# Patient Record
Sex: Female | Born: 1960 | Race: White | Hispanic: No | Marital: Married | Smoking: Never smoker
Health system: Southern US, Community
[De-identification: ages and names within clinical notes are randomized; demographics above are authoritative.]

## PROBLEM LIST (undated history)

## (undated) DIAGNOSIS — G510 Bell's palsy: Secondary | ICD-10-CM

## (undated) DIAGNOSIS — M1990 Unspecified osteoarthritis, unspecified site: Secondary | ICD-10-CM

## (undated) DIAGNOSIS — F32A Depression, unspecified: Secondary | ICD-10-CM

## (undated) DIAGNOSIS — C801 Malignant (primary) neoplasm, unspecified: Secondary | ICD-10-CM

## (undated) DIAGNOSIS — F329 Major depressive disorder, single episode, unspecified: Secondary | ICD-10-CM

## (undated) HISTORY — PX: BREAST SURGERY: SHX581

## (undated) HISTORY — PX: APPENDECTOMY: SHX54

## (undated) HISTORY — PX: COLONOSCOPY: SHX174

## (undated) HISTORY — PX: TUBAL LIGATION: SHX77

## (undated) HISTORY — PX: CERVICAL POLYPECTOMY: SHX88

---

## 2009-08-11 ENCOUNTER — Ambulatory Visit: Payer: Self-pay | Admitting: Cardiology

## 2009-08-15 ENCOUNTER — Ambulatory Visit: Payer: Self-pay | Admitting: Cardiovascular Disease

## 2009-08-15 ENCOUNTER — Ambulatory Visit (HOSPITAL_COMMUNITY): Admission: AD | Admit: 2009-08-15 | Discharge: 2009-08-15 | Payer: Self-pay | Admitting: Cardiovascular Disease

## 2014-11-28 ENCOUNTER — Telehealth: Payer: Self-pay | Admitting: *Deleted

## 2014-11-28 NOTE — Telephone Encounter (Signed)
Called pt and confirmed 12/06/14 appt w/ her.  Mailed before appt letter, calendar, welcoming packet & intake form to pt.  Placed a copy to records in Dr. Geralyn Flash box and took one to HIM to scan.

## 2014-12-06 ENCOUNTER — Ambulatory Visit (HOSPITAL_BASED_OUTPATIENT_CLINIC_OR_DEPARTMENT_OTHER): Payer: BLUE CROSS/BLUE SHIELD | Admitting: Hematology and Oncology

## 2014-12-06 ENCOUNTER — Ambulatory Visit: Payer: BLUE CROSS/BLUE SHIELD

## 2014-12-06 ENCOUNTER — Encounter: Payer: Self-pay | Admitting: Hematology and Oncology

## 2014-12-06 VITALS — BP 124/73 | HR 68 | Temp 98.4°F | Resp 18 | Ht 67.0 in | Wt 168.6 lb

## 2014-12-06 DIAGNOSIS — Z801 Family history of malignant neoplasm of trachea, bronchus and lung: Secondary | ICD-10-CM

## 2014-12-06 DIAGNOSIS — Z803 Family history of malignant neoplasm of breast: Secondary | ICD-10-CM

## 2014-12-06 DIAGNOSIS — D0511 Intraductal carcinoma in situ of right breast: Secondary | ICD-10-CM | POA: Insufficient documentation

## 2014-12-06 DIAGNOSIS — Z170 Estrogen receptor positive status [ER+]: Secondary | ICD-10-CM

## 2014-12-06 NOTE — Progress Notes (Signed)
[facility] NOTE  Patient Care Team: Adrian Prows, PA-C as PCP - General (Physician Assistant)  CHIEF COMPLAINTS/PURPOSE OF CONSULTATION:  Newly diagnosed right breast DCIS  HISTORY OF PRESENTING ILLNESS:  Bailey Berry 54 y.o. female is here because of recent diagnosis of right breast DCIS. She had a routine screening mammogram on 09/28/2014 which showed an increase in microcalcifications that led to a needle biopsy of 10/31/2014. It showed high-grade DCIS. She underwent right breast lumpectomy with clear margins and was started on tamoxifen. She developed some side effects to tamoxifen, mostly fatigue and she was asked to hold therapy for a few days. Her aunt is my patient. She had undergone genetic counselling. Results are pending  I reviewed her records extensively and collaborated the history with the patient.  MEDICAL HISTORY:  No illnesses other than the recent DCIS SURGICAL HISTORY: Lumpectomy SOCIAL HISTORY: Denies any tobacco alcohol or recreational drug use She works in her [employer] and has 4 children married originally came from [place]  FAMILY HISTORY: Paternal grandmother had breast cancer in 67s, paternal aunt had breast cancer at 19, father died of lung cancer, paternal uncle also had lung cancer  Gynecologic history: Age of menarche 15, age of first childbirth 29, she is still premenopausal. Gravida 4 para 4  ALLERGIES:  has No Known Allergies.  MEDICATIONS:  Current Outpatient Prescriptions  Medication Sig Dispense Refill  . Coenzyme Q10 (CO Q-10) 100 MG CAPS Take 1 capsule by mouth daily.    . Omega-3 Fatty Acids (FISH OIL PO) Take 1 tablet by mouth daily.    . Vitamin D, Ergocalciferol, (DRISDOL) 50000 UNITS CAPS capsule Take 50,000 Units by mouth once a week.  3  . tamoxifen (NOLVADEX) 20 MG tablet Take 20 mg by mouth daily.  6   No current facility-administered medications for this visit.    REVIEW OF SYSTEMS:    Constitutional: Denies fevers, chills or abnormal night sweats Eyes: Denies blurriness of vision, double vision or watery eyes Ears, nose, mouth, throat, and face: Denies mucositis or sore throat Respiratory: Denies cough, dyspnea or wheezes Cardiovascular: Denies palpitation, chest discomfort or lower extremity swelling Gastrointestinal:  Denies nausea, heartburn or change in bowel habits Skin: Denies abnormal skin rashes Lymphatics: Denies new lymphadenopathy or easy bruising Neurological:Denies numbness, tingling or new weaknesses Behavioral/Psych: Mood is stable, no new changes  All other systems were reviewed with the patient and are negative.  PHYSICAL EXAMINATION:  Filed Vitals:   12/06/14 1532  BP: 124/73  Pulse: 68  Temp: 98.4 F (36.9 C)  Resp: 18   Filed Weights   12/06/14 1532  Weight: 168 lb 9.6 oz (76.476 kg)    GENERAL:alert, no distress and comfortable SKIN: skin color, texture, turgor are normal, no rashes or significant lesions EYES: normal, conjunctiva are pink and non-injected, sclera clear OROPHARYNX:no exudate, no erythema and lips, buccal mucosa, and tongue normal  NECK: supple, thyroid normal size, non-tender, without nodularity LYMPH:  no palpable lymphadenopathy in the cervical, axillary or inguinal LUNGS: clear to auscultation and percussion with normal breathing effort HEART: regular rate & rhythm and no murmurs and no lower extremity edema ABDOMEN:abdomen soft, non-tender and normal bowel sounds Musculoskeletal:no cyanosis of digits and no clubbing  PSYCH: alert & oriented x 3 with fluent speech NEURO: no focal motor/sensory deficits  RADIOGRAPHIC STUDIES: I have personally reviewed the radiological reports and agreed with the findings in the report.  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 She started  Tamoxifen and feels fatigued.  She is here for a second opinion regarding her care Recommendation: 1. Agree with Dr.Katragadda regarding the plan for adjuvant tamoxifen and Radiation. 2. I discussed the [place] which predicts risk of relapse with the above treatment of 5% over 10 years.  Patient has undergone genetic testing and her aunt is my patient and we will watch for those results.  Patient will follow with her oncologist for her further care. Thank you for allowing me to participate in her care  All questions were answered. The patient knows to call the clinic with any problems, questions or concerns.    Rulon Eisenmenger, MD 8:56 PM

## 2014-12-06 NOTE — Progress Notes (Signed)
Checked in new pt with no financial concerns at this time. ° °

## 2014-12-06 NOTE — Assessment & Plan Note (Signed)
DCIS Breast: ER PR Positive Grade 3, without comedo necrosis: S/P Lumpectomy and starting XRT next week. She started Tamoxifen and feels fatigued.  She is here for a second opinion regarding her care Recommendation: 1. Agree with Dr.Katragadda regarding the plan for adjuvant tamoxifen and Radiation. 2. I discussed the [place] which predicts risk of relapse with the above treatment of 5% over 10 years.  Patient has undergone genetic testing and her aunt is my patient and we will watch for those results.  Patient will follow with her oncologist for her further care. Thank you for allowing me to participate in her care

## 2014-12-07 ENCOUNTER — Telehealth: Payer: Self-pay | Admitting: *Deleted

## 2014-12-07 NOTE — Telephone Encounter (Signed)
Received progress notes from [facility] to scan.

## 2014-12-07 NOTE — Progress Notes (Signed)
Addendum: Correction to the diagnosis in the assessment and plan: DCIS involving right breast (not left breast)

## 2016-06-27 ENCOUNTER — Ambulatory Visit (INDEPENDENT_AMBULATORY_CARE_PROVIDER_SITE_OTHER): Payer: Self-pay | Admitting: Sports Medicine

## 2016-06-28 ENCOUNTER — Ambulatory Visit (INDEPENDENT_AMBULATORY_CARE_PROVIDER_SITE_OTHER): Payer: BLUE CROSS/BLUE SHIELD | Admitting: Sports Medicine

## 2016-06-28 DIAGNOSIS — M25562 Pain in left knee: Secondary | ICD-10-CM | POA: Diagnosis not present

## 2016-06-28 DIAGNOSIS — M25462 Effusion, left knee: Secondary | ICD-10-CM | POA: Diagnosis not present

## 2016-07-08 ENCOUNTER — Ambulatory Visit (INDEPENDENT_AMBULATORY_CARE_PROVIDER_SITE_OTHER): Payer: Self-pay | Admitting: Physician Assistant

## 2016-07-23 ENCOUNTER — Telehealth (INDEPENDENT_AMBULATORY_CARE_PROVIDER_SITE_OTHER): Payer: Self-pay | Admitting: Sports Medicine

## 2016-07-23 LAB — SYNOVIAL FLUID, CRYSTAL
CRYSTALS, JOINT FLUID: POSITIVE
NUCLEATED CELL COUNT: 570

## 2016-07-23 MED ORDER — COLCHICINE 0.6 MG PO CAPS: 1.0000 | ORAL_CAPSULE | Freq: Two times a day (BID) | ORAL | 1 refills | Status: DC | PRN

## 2016-07-23 NOTE — Telephone Encounter (Signed)
Theone Murdoch (sister-in-law) calling because patient had fluid drawn from the L knee at her last visit on 06/28/16.  She is asking if the fluid was sent to the lab and if so, what are the results.  Patient is in extreme pain, the swelling has increased, and she is unable to bend her knee.  Levada Dy states patient is seeking advice ASAP.  Will make appointment once she has spoke to the doctor or an clinic assistant.

## 2016-07-23 NOTE — Telephone Encounter (Signed)
Talked with patient and advised her of message concerning Rx that was sent into her pharmacy.

## 2016-07-23 NOTE — Telephone Encounter (Signed)
Prescription for colchicine called in. She did have a positive finding for pseudogout. There is no other intervention other than medication to perform at this time unless she does not have any improvement with the colchicine.  I'm happy to see her back if she is having persistent pain & swelling. If any lack of improvement please have her worked in  for repeat aspiration & injection with consideration of obtaining an MRI.

## 2016-07-23 NOTE — Telephone Encounter (Signed)
Patient did have labs drawn from her last office visit in [facility]. She would like to know about the results. Please see message concerning left knee pain.

## 2016-07-24 ENCOUNTER — Telehealth (INDEPENDENT_AMBULATORY_CARE_PROVIDER_SITE_OTHER): Payer: Self-pay

## 2016-07-24 MED ORDER — COLCHICINE 0.6 MG PO TABS: 0.6000 mg | ORAL_TABLET | Freq: Two times a day (BID) | ORAL | 1 refills | Status: DC | PRN

## 2016-07-24 MED ORDER — MELOXICAM 15 MG PO TABS: 15.0000 mg | ORAL_TABLET | Freq: Every day | ORAL | 0 refills | Status: DC

## 2016-07-24 NOTE — Telephone Encounter (Signed)
Received a fax stating that Colchicine 0.6mg  tablets was rejected.  Would like to know if Rx can be changed to something different? Please advise.

## 2016-07-24 NOTE — Telephone Encounter (Signed)
Generic for colchicine sent in to see if this will be covered. Additionally I'm sending a prescription for meloxicam that she can try to take to help reduce any additional inflammation. Please call & inform her of the above.

## 2016-07-24 NOTE — Addendum Note (Signed)
Addended by: Teresa Coombs D on: 07/24/2016 01:26 PM   Modules accepted: Orders

## 2016-07-25 NOTE — Telephone Encounter (Signed)
Talked with patient and advised her of message concerning Rx's

## 2016-08-06 ENCOUNTER — Telehealth (INDEPENDENT_AMBULATORY_CARE_PROVIDER_SITE_OTHER): Payer: Self-pay | Admitting: Physician Assistant

## 2016-08-06 NOTE — Telephone Encounter (Signed)
Patient says Dr (she doesn't know what Dr.) prescribed her some medication for her knee pain, this miedication is  not covered by insurance, its been two weeks. she doesn't know the name of the medication nor what it is for. She is requesting a different medication that her insurance will cover   pls call to discuss cb#: [phone]

## 2016-08-07 NOTE — Telephone Encounter (Signed)
I called spoke with Bailey Berry patients friend, saved in patients chart and explained the current situation. She does have pseudogout looking at crystals analysis. Advised she does not need to take medication if she is not symptomatic. Advised that unfortunately there is no other medication on the market for this. Advised it would be something she would take until symptoms resolved which would typically be within a couple days with this medication. She expressed understanding and is going to relay this to the patient.

## 2016-08-07 NOTE — Telephone Encounter (Signed)
I called patient, she states generic for colchicine and colchicine are both not covered. She states her labs were negative. I did not see uric acid in her chart. Do you want to pursue medication refill or disregard? She states the swelling has resolved and she is feeling better.

## 2016-08-07 NOTE — Telephone Encounter (Signed)
She is not experiencing any pain it is fine to hold off on filling the medication. She did have pseudogout on Crystal analysis. If colchicine is unaffordable & is okay to not fill this but it is an option down the road if she has worsening symptoms. Unfortunately there are no other generic alternatives.

## 2017-06-16 ENCOUNTER — Encounter: Payer: Self-pay | Admitting: Sports Medicine

## 2017-06-16 ENCOUNTER — Ambulatory Visit (INDEPENDENT_AMBULATORY_CARE_PROVIDER_SITE_OTHER): Payer: BLUE CROSS/BLUE SHIELD

## 2017-06-16 ENCOUNTER — Ambulatory Visit (INDEPENDENT_AMBULATORY_CARE_PROVIDER_SITE_OTHER): Payer: BLUE CROSS/BLUE SHIELD | Admitting: Sports Medicine

## 2017-06-16 ENCOUNTER — Ambulatory Visit: Payer: BLUE CROSS/BLUE SHIELD

## 2017-06-16 VITALS — BP 122/82 | HR 75 | Ht 67.0 in | Wt 159.4 lb

## 2017-06-16 DIAGNOSIS — M25512 Pain in left shoulder: Secondary | ICD-10-CM

## 2017-06-16 DIAGNOSIS — M4722 Other spondylosis with radiculopathy, cervical region: Secondary | ICD-10-CM | POA: Diagnosis not present

## 2017-06-16 DIAGNOSIS — G8929 Other chronic pain: Secondary | ICD-10-CM

## 2017-06-16 DIAGNOSIS — M25511 Pain in right shoulder: Secondary | ICD-10-CM

## 2017-06-16 MED ORDER — METHYLPREDNISOLONE 4 MG PO TBPK: ORAL_TABLET | ORAL | 0 refills | Status: DC

## 2017-06-16 MED ORDER — GABAPENTIN 300 MG PO CAPS: ORAL_CAPSULE | ORAL | 1 refills | Status: DC

## 2017-06-16 NOTE — Patient Instructions (Addendum)
++++++++++++++++++++++++++++++++++++++++++++    If you would like to hold off on picking up the gabapentin that is okay but I do recommend that you pick up the Medrol Dosepak.  ++++++++++++++++++++++++++++++++++++++++++++  Also check out [employer]" which is a program developed by Dr. Minerva Ends.   There are links to a couple of his YouTube Videos below and I would like to see performing one of his videos 5-6 days per week.    A good intro video is: "Independence from Pain 7-minute Video" - [email or web address].com   His more advanced video is: "Powerful Posture and Pain Relief: 12 minutes of Foundation Training" - https://youtu.be/[email or web address]  Do not try to attempt this entire video when first beginning.    Try breaking of each exercise that he goes into shorter segments.  Otherwise if they perform an exercise for 45 seconds, start with 15 seconds and rest and then resume when they begin the new activity.    If you work your way up to doing this 12 minute video, I expect you will see significant improvements in your pain.  If you enjoy his videos and would like to find out more you can look on his website: [email or web address]  He has a workout streaming option as well as a DVD set available for purchase.  Amazon has the best price for his DVDs.

## 2017-06-16 NOTE — Progress Notes (Signed)
OFFICE VISIT NOTE Bailey Berry. Bailey Berry, [place] at [facility] [phone]  Bailey Berry - 56 y.o. female MRN [id number]  Date of birth: 1961-03-17  Visit Date: 06/16/2017  PCP: Bailey Prows, PA-C   Referred by: Bailey Prows, PA-C  Bailey Berry, CMA acting as scribe for Dr. Paulla Fore.  SUBJECTIVE:   Chief Complaint  Patient presents with  . bilateral shoulder pain   HPI: As below and per problem based documentation when appropriate.  Bailey Berry is a former pt at [facility] presenting today for evaluation of bilateral shoulder pain.  Pain has been recent x 3-4 years but has gotten worse over the past 4-5 months.  No known injury or trauma to the shoulders or c-spine.   The pain is described as pinching and is rated as 5/10-10/10 with movement.  Worsened while lying in bed. Pain is worse when trying to grab/grasp objects. She denies weakness in either arm.  Improves with rest. Therapies tried include : She has injection about 3 years. She has been seen by PT in the past and she did benefit from this but sx have returned and are worse. She is not taking OTC meds for the pain. She has not tried using heat or ice.   Other associated symptoms include: She has some radiation of pain into her arm and numbness in her hands.   She has not had xray c-spine or xray of either shoulder in over a year.      Review of Systems  Constitutional: Positive for chills. Negative for fever.  Respiratory: Negative for shortness of breath and wheezing.   Cardiovascular: Negative for chest pain and palpitations.  Musculoskeletal: Positive for neck pain.  Neurological: Positive for tingling. Negative for dizziness, weakness and headaches.    Otherwise per HPI.  HISTORY & PERTINENT PRIOR DATA:  Prior History reviewed and updated per electronic medical record.  Significant history, findings, studies and interim changes include:  reports  that  has never smoked. she has never used smokeless tobacco. No results for input(s): HGBA1C, LABURIC, CREATINE in the last 8760 hours. No specialty comments available. Problem  Osteoarthritis of Spine With Radiculopathy, Cervical Region   MRI Cervical Spine: 1. Severe right foraminal stenosis at C5-6 secondary to asymmetric uncovertebral and facet disease. 2. Moderate right central canal narrowing at C5-6 with some distortion of the right-sided cord but no abnormal cord signal. 3. Moderate central and bilateral foraminal stenosis at C6-7 secondary to a broad-based disc osteophyte complex and bilateral uncovertebral spurring. 4. Mild right foraminal narrowing at C4-5. 5. Mild central and bilateral foraminal narrowing at C3-4.      OBJECTIVE:  VS:  HT:5\' 7"  (170.2 cm)   WT:159 lb 6.4 oz (72.3 kg)  BMI:24.96    BP:122/82  HR:75bpm  TEMP: ( )  RESP:98 %  PHYSICAL EXAM: Constitutional: WDWN, Non-toxic appearing. Psychiatric: Alert & appropriately interactive. Not depressed or anxious appearing. Respiratory: No increased work of breathing. Trachea Midline Eyes: Pupils are equal. EOM intact without nystagmus. No scleral icterus  UPPER EXTREMITIES No clubbing or cyanosis appreciated Capillary Refill is normal, less than 2 seconds No signficant upper extremity generalized edema Radial Pulses: Normal and symmetrically palpable Sensation in UE dermatomes: intact to light touch    SHOULDER Findings: No stiffness, no weakness, no crepitus noted  Neck:   Well aligned, no significant torticollis  Midline Bony TTP: none   Paraspinal Muscle Spasm: Yes, bilateral  Limited side bending  and rotation of cervical spine bilaterally.  NEURAL TENSION SIGNS Right Left  Brachial Plexus Squeeze: positive, moderate pain positive, moderate pain  Arm Squeeze Test: positive, severe pain positive, moderate pain  Spurling's Compression Test: positive, mild pain positive, mild pain    Lhermitte's Compression test: Negative, no radiating pain   REFLEXES Right Left  DTR - C5 -Biceps  2+ 2+  DTR - C6 - Brachiorad 1+ 2+  DTR - C7 - Triceps 2+ 2+   UMN - Hoffman's Negative/Normal Negative/Normal  UMN - Pectoral Negative/Normal Negative/Normal   MOTOR TESTING: Intact in all UE myotomes    No additional findings.   ASSESSMENT & PLAN:   1. Chronic pain of both shoulders   2. Osteoarthritis of spine with radiculopathy, cervical region    PLAN:    Osteoarthritis of spine with radiculopathy, cervical region Bilateral shoulder pain consistent with cervical radiculitis.  She does have a slightly diminished brachioradialis reflex on the right but otherwise her upper extremity strength is intact.  She does have pain with Spurling's compression test and should benefit from steroid Dosepak and gabapentin.  We will have him again on postural exercises.  If any lack of improvement MRI of her cervical spine would be indicated she has been through aggressive physical therapy and is having now worsening motor symptoms but no focal weakness today.  No evidence of upper motor nerve lesions however the cramping is slightly concerning for this and if any lack of improvement once again MRI prior to her next visit will be indicated.   ++++++++++++++++++++++++++++++++++++++++++++ Orders & Meds: Orders Placed This Encounter  Procedures  . DG Cervical Spine Complete    Meds ordered this encounter  Medications  . DISCONTD: methylPREDNISolone (MEDROL DOSEPAK) 4 MG TBPK tablet    Sig: Take by mouth as directed. Take 6 tablets on the first day prescribed then as directed.    Dispense:  21 tablet    Refill:  0  . DISCONTD: gabapentin (NEURONTIN) 300 MG capsule    Sig: Start with 1 tab po qhs X 1 week, then increase to 1 tab po bid X 1 week then 1 tab po tid prn    Dispense:  90 capsule    Refill:  1    ++++++++++++++++++++++++++++++++++++++++++++ Follow-up: Return in about 4 weeks  (around 07/14/2017).   Pertinent documentation may be included in additional procedure notes, imaging studies, problem based documentation and patient instructions. Please see these sections of the encounter for additional information regarding this visit. CMA/ATC served as Education administrator during this visit. History, Physical, and Plan performed by medical provider. Documentation and orders reviewed and attested to.      Gerda Diss, [facility] Physician

## 2017-06-16 NOTE — Assessment & Plan Note (Addendum)
Bilateral shoulder pain consistent with cervical radiculitis.  She does have a slightly diminished brachioradialis reflex on the right but otherwise her upper extremity strength is intact.  She does have pain with Spurling's compression test and should benefit from steroid Dosepak and gabapentin.  We will have him again on postural exercises.  If any lack of improvement MRI of her cervical spine would be indicated she has been through aggressive physical therapy and is having now worsening motor symptoms but no focal weakness today.  No evidence of upper motor nerve lesions however the cramping is slightly concerning for this and if any lack of improvement once again MRI prior to her next visit will be indicated.

## 2017-07-14 ENCOUNTER — Encounter: Payer: Self-pay | Admitting: Sports Medicine

## 2017-07-14 ENCOUNTER — Ambulatory Visit: Payer: BLUE CROSS/BLUE SHIELD | Admitting: Sports Medicine

## 2017-07-14 VITALS — BP 104/70 | HR 71 | Ht 67.0 in | Wt 162.4 lb

## 2017-07-14 DIAGNOSIS — M25512 Pain in left shoulder: Secondary | ICD-10-CM

## 2017-07-14 DIAGNOSIS — M4722 Other spondylosis with radiculopathy, cervical region: Secondary | ICD-10-CM

## 2017-07-14 DIAGNOSIS — G8929 Other chronic pain: Secondary | ICD-10-CM | POA: Diagnosis not present

## 2017-07-14 DIAGNOSIS — M25511 Pain in right shoulder: Secondary | ICD-10-CM

## 2017-07-14 NOTE — Assessment & Plan Note (Signed)
Persistent symptoms in spite of conservative measures.  Mild improvement with corticosteroids but no change in symptoms with gabapentinoids or therapeutic exercises.  MRI indicated at this time.  Follow-up after MRI to review results.  Question if cervical ribs are  contributing to potential TOS versus true cervical radiculitis.

## 2017-07-14 NOTE — Patient Instructions (Addendum)
We are ordering an MRI for you today.  The imaging office will be calling you to schedule your appointment after we obtain authorization from your insurance company.   Please be sure you have signed up for MyChart so that we can get your results to you.  We will be in touch with you as soon as we can.  Please know, it can take up to 3-4 business days for the radiologist and Dr. Rigby to have time to review the results and determine the best appropriate action.  If there is something that appears to be surgical or needs a referral to other specialists we will let you know through MyChart or telephone.  Otherwise we will plan to schedule a follow up appointment with Dr. Rigby once we have the results.    [facility]: [phone]  

## 2017-07-14 NOTE — Progress Notes (Addendum)
OFFICE VISIT NOTE [name]. Dayzha Pogosyan, [place] at [facility] [phone]  Johnnetta Holstine - 56 y.o. female MRN [id number]  Date of birth: 1961/07/07  Visit Date: 07/14/2017  PCP: Adrian Prows, PA-C   Referred by: Adrian Prows, PA-C  Josepha Pigg, CMA acting as scribe for Dr. Paulla Fore.  SUBJECTIVE:   Chief Complaint  Patient presents with  . Follow-up    bilateral shoulder pain   HPI: As below and per problem based documentation when appropriate.  Bailey Berry is an established patient presenting today in follow-up of bilateral shoulder pain and osteoarthritis c-spine with radiculopathy. She was last seen 06/16/2017 and was prescribed a Medrol Dosepak and Gabapentin was recommended. She was provided with website for [facility] exercises.   Pt has completed Medrol Dosepak. She did pick up the Gabapentin and tried taking it for about 10 days but doesn't feel like it was helping with her sx. She did notice some improvement with the Medrol Dosepak. She is still having a lot of pain when lying down at night for bed. 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 She is not taking any OTC meds for the pain. She has not tried using heat or ice. Pain improves when at rest. When she tries to grab something the pain does radiate into the posterior aspect of both forearms. Overall she does feel like sx have gotten a little better but definitely not where she would like to be. She has noticed some weakness in both arms. She continues to have trouble with her fingers "getting stuck" while she is trying to hold things.     Review of Systems  Constitutional: Positive for chills. Negative for fever and malaise/fatigue.  Respiratory: Positive for shortness of breath. Negative for wheezing.   Cardiovascular: Positive for palpitations. Negative for chest pain.  Musculoskeletal:  Positive for myalgias.  Neurological: Positive for dizziness and weakness. Negative for tingling and headaches.    Otherwise per HPI.  HISTORY & PERTINENT PRIOR DATA:  No specialty comments available. She reports that  has never smoked. she has never used smokeless tobacco. No results for input(s): HGBA1C, LABURIC, CREATINE in the last 8760 hours.  Invalid input(s): CR Allergies reviewed per EMR Prior to Admission medications   Medication Sig Start Date End Date Taking? Authorizing Provider  lovastatin (MEVACOR) 20 MG tablet Take 20 mg by mouth at bedtime. 05/18/17  Yes [provider]  tamoxifen (NOLVADEX) 20 MG tablet Take 20 mg by mouth daily. 11/21/14  Yes [provider]   Patient Active Problem List   Diagnosis Date Noted  . Osteoarthritis of spine with radiculopathy, cervical region 06/16/2017  . Ductal carcinoma in situ (DCIS) of right breast 12/06/2014   History reviewed. No pertinent past medical history. History reviewed. No pertinent family history. History reviewed. No pertinent surgical history. Social History   Occupational History  . Not on file  Tobacco Use  . Smoking status: Never Smoker  . Smokeless tobacco: Never Used  Substance and Sexual Activity  . Alcohol use: Not on file  . Drug use: Not on file  . Sexual activity: Not on file    OBJECTIVE:  VS:  HT:5\' 7"  (170.2 cm)   WT:162 lb 6.4 oz (73.7 kg)  BMI:25.43    BP:104/70  HR:71bpm  TEMP: ( )  RESP:98 % EXAM: Findings:  WDWN, NAD, Non-toxic  appearing Alert & appropriately interactive Not depressed or anxious appearing No increased work of breathing. Pupils are equal. EOM intact without nystagmus No clubbing or cyanosis of the extremities appreciated No significant rashes/lesions/ulcerations overlying the examined area. Radial pulses 2+/4.  No significant generalized UE edema. Sensation intact to light touch in upper extremities.  Neck & Shoulders: Well aligned, no significant  torticollis No significant midline tenderness.   Right paraspinal musculature. Cervical ROM: Slightly limited in cervical rotation and side bending to the right. NEURAL TENSION SIGNS Right       Brachial Plexus Squeeze: tender       Arm Squeeze Test: tender       Spurling's Compression Test:  Ipsilateral -mild pain without radiation           Contralateral marked discomfort Left       Brachial Plexus Squeeze: Mild pain      Arm Squeeze Test: No pain       Spurling's Compression Test:  Ipsilateral -causes pain to the contralateral right base of the neck           Contralateral no pain Lhermitte's Compression test:  Pain causing radiation down the spine   REFLEXES                           Right                         Left DTR - C5 -Biceps               3+/4                       3+/4 DTR - C6 - Brachiorad  1+/4                       3+/4 DTR - C7 - Triceps              1+/4                       3+/4 UMN - Hoffman's    questionable negative/Normal  MOTOR TESTING: Small amount of weakness with elbow extension on R compared to left otherwise intact    RADIOLOGY: DG Cervical Spine Complete CLINICAL DATA:  56 year old female chronic neck and bilateral shoulder pain. No recent trauma/ injury. Initial encounter.  EXAM: CERVICAL SPINE - COMPLETE 4+ VIEW  COMPARISON:  None.  FINDINGS: Normal alignment without fracture or abnormal prevertebral soft tissue swelling.  Moderate C6-7 disc space narrowing with osteophyte. Bilateral uncinate hypertrophy. Mild bilateral C6-7 bony neural foraminal narrowing.  Tiny cervical ribs.  Lung apices clear.  Question aortic knob calcification.  IMPRESSION: Moderate C6-7 cervical spondylotic changes with mild bilateral bony neural foraminal narrowing.  Electronically Signed   By: Genia Del M.D.   On: 06/16/2017 19:55  ASSESSMENT & PLAN:     ICD-10-CM   1. Chronic pain of both shoulders M25.511 MR Cervical Spine Wo Contrast    G89.29    M25.512   2. Osteoarthritis of spine with radiculopathy, cervical region M47.22 MR Cervical Spine Wo Contrast   ================================================================= Osteoarthritis of spine with radiculopathy, cervical region Persistent symptoms in spite of conservative measures.  Mild improvement with corticosteroids but no change in symptoms with gabapentinoids or therapeutic exercises.  MRI indicated at this time.  Follow-up after MRI to review results.  Question if cervical ribs are  contributing to potential TOS versus true cervical radiculitis.  No notes on file =================================================================  Follow-up: Return for MRI review.   CMA/ATC served as Education administrator during this visit. History, Physical, and Plan performed by medical provider. Documentation and orders reviewed and attested to.      Teresa Coombs, [place] [facility] Physician

## 2017-07-25 ENCOUNTER — Ambulatory Visit (INDEPENDENT_AMBULATORY_CARE_PROVIDER_SITE_OTHER): Payer: BLUE CROSS/BLUE SHIELD | Admitting: Orthopaedic Surgery

## 2017-07-27 ENCOUNTER — Ambulatory Visit
Admission: RE | Admit: 2017-07-27 | Discharge: 2017-07-27 | Disposition: A | Discharge status: Home / Self Care | Payer: BLUE CROSS/BLUE SHIELD | Source: Ambulatory Visit | Attending: Sports Medicine | Admitting: Sports Medicine

## 2017-07-27 DIAGNOSIS — G8929 Other chronic pain: Secondary | ICD-10-CM

## 2017-07-27 DIAGNOSIS — M25512 Pain in left shoulder: Principal | ICD-10-CM

## 2017-07-27 DIAGNOSIS — M25511 Pain in right shoulder: Principal | ICD-10-CM

## 2017-07-27 DIAGNOSIS — M4722 Other spondylosis with radiculopathy, cervical region: Secondary | ICD-10-CM

## 2017-08-04 ENCOUNTER — Encounter: Payer: Self-pay | Admitting: Sports Medicine

## 2017-08-04 ENCOUNTER — Ambulatory Visit: Payer: BLUE CROSS/BLUE SHIELD | Admitting: Sports Medicine

## 2017-08-04 VITALS — BP 116/74 | HR 78 | Ht 67.0 in | Wt 162.2 lb

## 2017-08-04 DIAGNOSIS — M25512 Pain in left shoulder: Secondary | ICD-10-CM

## 2017-08-04 DIAGNOSIS — G8929 Other chronic pain: Secondary | ICD-10-CM | POA: Diagnosis not present

## 2017-08-04 DIAGNOSIS — M4722 Other spondylosis with radiculopathy, cervical region: Secondary | ICD-10-CM

## 2017-08-04 DIAGNOSIS — M25511 Pain in right shoulder: Secondary | ICD-10-CM

## 2017-08-04 NOTE — Progress Notes (Signed)
Called pt and scheduled her for f/u today at 2:40 pm. - MW

## 2017-08-04 NOTE — Patient Instructions (Signed)
[facility] will be reaching out to schedule the epidural steroid injection.  If you have not heard from them in the next several days please call our office and inform us of this.

## 2017-08-04 NOTE — Assessment & Plan Note (Signed)
Long discussion today regarding interventional options.  Patient would like to avoid surgical intervention at this time although we did recommend neurosurgical consultation given the increased risk of spinal cord injury in the setting of even a minimal impact given the amount of stenosis.  There are no overt cord changes at this time but I did discuss that there is an increased risk of cervical spinal cord injury with any type of even minimal impact. She would like to proceed with epidural steroid injection and I will plan to follow-up with her to ensure good clinical improvement.  If any lack of improvement, persistent symptoms or worsening symptoms neurosurgical intervention will be warranted in the interim.

## 2017-08-04 NOTE — Progress Notes (Signed)
Bailey Berry. Bailey Berry, [place] at [place]  Bailey Berry - 56 y.o. female MRN [id number]  Date of birth: Aug 23, 1961 Scribe for today's visit: Wendy Poet, ATC    SUBJECTIVE:  Bailey Berry is here for evaluation of Follow-up (c-spine pain and B shoulder pain) .   Compared to the last office visit on 07/14/17, her previously described symptoms of B neck/upper trap pain and L arm pain show no change. Current symptoms are currently mild in nature while she's at relative rest but severe when she's attempting to grab a pan and lift something. She has not been using any medications currently but has tried both a Medrol dose pack and Gabapentin previously.   ROS Reports night time disturbances. Denies fevers, chills, or night sweats. Denies unexplained weight loss. Reports personal history of cancer. Denies changes in bowel or bladder habits. Denies recent unreported falls. Denies new or worsening dyspnea or wheezing. Reports headaches or dizziness.  Reports numbness, tingling or weakness  In the extremities.  Denies dizziness or presyncopal episodes Denies lower extremity edema    HISTORY & PERTINENT PRIOR DATA:  Prior History reviewed and updated per electronic medical record. Significant history, findings, studies and interim changes include: No additional findings.  reports that  has never smoked. she has never used smokeless tobacco. No results for input(s): HGBA1C, LABURIC, CREATINE in the last 8760 hours. Problem  Osteoarthritis of Spine With Radiculopathy, Cervical Region   MRI Cervical Spine: 1. Severe right foraminal stenosis at C5-6 secondary to asymmetric uncovertebral and facet disease. 2. Moderate right central canal narrowing at C5-6 with some distortion of the right-sided cord but no abnormal cord signal. 3. Moderate central and bilateral foraminal stenosis at C6-7 secondary to a broad-based disc  osteophyte complex and bilateral uncovertebral spurring. 4. Mild right foraminal narrowing at C4-5. 5. Mild central and bilateral foraminal narrowing at C3-4.      OBJECTIVE:  VS:  HT:5\' 7"  (170.2 cm)   WT:162 lb 3.2 oz (73.6 kg)  BMI:25.4    BP:116/74  HR:78bpm  TEMP: ( )  RESP:98 %  PHYSICAL EXAM: Constitutional: WDWN, Non-toxic appearing. Psychiatric: Alert & appropriately interactive.Not depressed or anxious appearing. Respiratory: No increased work of breathing. Trachea Midline Eyes: Pupils are equal. EOM intact without nystagmus. No scleral icterus  Bilateral upper extremity NEUROVASCULAR exam: No clubbing or cyanosis appreciated No significant pretibial/lower extremity peripheral edema No significant venous stasis changes Capillary Refill: normal, less than 2 seconds  Radial Pulses: symmetrically palpable   Sensation in examined extremities: Deficit noted - Bilateral C5-6 generalized dysesthesia.  Neck & Shoulders: Well aligned, no significant torticollis No significant midline tenderness.   Cervical ROM: Limited to side bending, rotation and flexion extension  NEURAL TENSION SIGNS Right       Brachial Plexus Squeeze: Painful       Arm Squeeze Test: Mildly painful      Spurling's Compression Test:  Ipsilateral -deferred  Left       Brachial Plexus Squeeze: Painful       Arm Squeeze Test: Painful       Spurling's Compression Test:  Ipsilateral -deferred  Lhermitte's Compression test:  Deferred   REFLEXES                           Right  Left DTR - C5 -Biceps               2+/4                       2+/4 DTR - C6 - Brachiorad  1+/4                       2+/4 DTR - C7 - Triceps              2+/4                       2+/4 UMN - Hoffman's negative/Normal negative/Normal  MOTOR TESTING: Intact in all UE myotomes  ASSESSMENT & PLAN:   1. Osteoarthritis of spine with radiculopathy, cervical region   2. Chronic pain of both shoulders      Plan:>50% of this 25 minute visit spent in direct patient counseling and/or coordination of care.  Discussion was focused on education regarding the in discussing the pathoetiology and anticipated clinical course of the above condition.  Osteoarthritis of spine with radiculopathy, cervical region Long discussion today regarding interventional options.  Patient would like to avoid surgical intervention at this time although we did recommend neurosurgical consultation given the increased risk of spinal cord injury in the setting of even a minimal impact given the amount of stenosis.  There are no overt cord changes at this time but I did discuss that there is an increased risk of cervical spinal cord injury with any type of even minimal impact. She would like to proceed with epidural steroid injection and I will plan to follow-up with her to ensure good clinical improvement.  If any lack of improvement, persistent symptoms or worsening symptoms neurosurgical intervention will be warranted in the interim.   ++++++++++++++++++++++++++++++++++++++++++++ Orders:  Orders Placed This Encounter  Procedures  . DG INJECT DIAG/THERA/INC NEEDLE/CATH/PLC EPI/LUMB/SAC W/IMG    Meds:  No orders of the defined types were placed in this encounter.   ++++++++++++++++++++++++++++++++++++++++++++ Follow-up: Return in about 8 weeks (around 09/29/2017).   Pertinent documentation may be included in additional procedure notes, imaging studies, problem based documentation and patient instructions. Please see these sections of the encounter for additional information regarding this visit. CMA/ATC served as Education administrator during this visit. History, Physical, and Plan performed by medical provider. Documentation and orders reviewed and attested to.      [facility] Physician    08/04/2017 6:51 PM

## 2017-08-12 ENCOUNTER — Other Ambulatory Visit: Payer: Self-pay

## 2017-08-12 ENCOUNTER — Telehealth: Payer: Self-pay

## 2017-08-12 NOTE — Telephone Encounter (Signed)
Copied from [place]. Topic: Referral - Question >> Aug 12, 2017  1:55 PM Valla Leaver wrote: Reason for CRM: Dr. Paulla Fore placed an order for epidural for this patient but it requires authorization from [facility]. [facility] can not authorize it. Patient scheduled for 12/10. Please advise.

## 2017-08-13 NOTE — Telephone Encounter (Signed)
Authorization obtained by provider, and documented in appointment and order notes.  Auth# [id number] No further action needed.

## 2017-08-13 NOTE — Telephone Encounter (Signed)
Peer-to-peer performed authorization number is [id number] [facility].  Appointment on 08/18/2017 his first day of approval is approved.

## 2017-08-18 ENCOUNTER — Other Ambulatory Visit: Payer: BLUE CROSS/BLUE SHIELD

## 2017-09-03 ENCOUNTER — Ambulatory Visit
Admission: RE | Admit: 2017-09-03 | Discharge: 2017-09-03 | Disposition: A | Discharge status: Home / Self Care | Payer: BLUE CROSS/BLUE SHIELD | Source: Ambulatory Visit | Attending: Sports Medicine | Admitting: Sports Medicine

## 2017-09-03 DIAGNOSIS — M4722 Other spondylosis with radiculopathy, cervical region: Secondary | ICD-10-CM

## 2017-09-03 DIAGNOSIS — M25511 Pain in right shoulder: Secondary | ICD-10-CM

## 2017-09-03 DIAGNOSIS — M25512 Pain in left shoulder: Secondary | ICD-10-CM

## 2017-09-03 DIAGNOSIS — G8929 Other chronic pain: Secondary | ICD-10-CM

## 2017-09-03 MED ORDER — TRIAMCINOLONE ACETONIDE 40 MG/ML IJ SUSP (RADIOLOGY)
60.0000 mg | Freq: Once | INTRAMUSCULAR | Status: AC
  Administered 2017-09-03: 60 mg via EPIDURAL

## 2017-09-03 MED ORDER — IOPAMIDOL (ISOVUE-M 300) INJECTION 61%
1.0000 mL | Freq: Once | INTRAMUSCULAR | Status: AC | PRN
  Administered 2017-09-03: 1 mL via EPIDURAL

## 2017-09-03 NOTE — Discharge Instructions (Signed)

## 2017-09-17 ENCOUNTER — Telehealth: Payer: Self-pay | Admitting: Sports Medicine

## 2017-09-17 ENCOUNTER — Other Ambulatory Visit: Payer: Self-pay | Admitting: Physical Therapy

## 2017-09-17 DIAGNOSIS — M4722 Other spondylosis with radiculopathy, cervical region: Secondary | ICD-10-CM

## 2017-09-17 NOTE — Telephone Encounter (Signed)
Copied from [place] [id number]. Topic: General - Other >> Sep 17, 2017 10:57 AM Darl Householder, RMA wrote: Reason for CRM: Pt is requesting a callback from Dr. Paulla Fore concerning appt for back injection, please call patient back at [phone]

## 2017-09-17 NOTE — Telephone Encounter (Signed)
Returned pt's call.  Pt is inquiring about getting a second cervical spine epidural.  Routed message to Dr. Paulla Fore for review.

## 2017-09-17 NOTE — Telephone Encounter (Signed)
Referral placed for her 2nd cervical epidural and pt called and notified.

## 2017-09-17 NOTE — Telephone Encounter (Signed)
See note

## 2017-09-17 NOTE — Telephone Encounter (Signed)
Okay to repeat injection once if she had some improvement with the last injection.

## 2017-09-23 NOTE — Telephone Encounter (Signed)
[facility] called to inform that prior authorization was needed for second epidural injection.  Will start pre-authorization process, may need peer to peer as we did last time.  CPT Code for injection is: [place]: [phone]

## 2017-09-23 NOTE — Telephone Encounter (Deleted)
Authorization # [id number]

## 2017-09-25 NOTE — Telephone Encounter (Signed)
Please advise once PA has been initiated, thanks!

## 2017-09-29 ENCOUNTER — Ambulatory Visit: Payer: BLUE CROSS/BLUE SHIELD | Admitting: Sports Medicine

## 2017-10-01 NOTE — Telephone Encounter (Signed)
Prior auth needs Peer to Peer, informed Dr. Paulla Fore. We will set the peer to peer up for this afternoon.

## 2017-10-02 ENCOUNTER — Ambulatory Visit
Admission: RE | Admit: 2017-10-02 | Discharge: 2017-10-02 | Disposition: A | Discharge status: Home / Self Care | Payer: BLUE CROSS/BLUE SHIELD | Source: Ambulatory Visit | Attending: Sports Medicine | Admitting: Sports Medicine

## 2017-10-02 ENCOUNTER — Other Ambulatory Visit: Payer: BLUE CROSS/BLUE SHIELD

## 2017-10-02 DIAGNOSIS — M4722 Other spondylosis with radiculopathy, cervical region: Secondary | ICD-10-CM

## 2017-10-02 MED ORDER — IOPAMIDOL (ISOVUE-M 300) INJECTION 61%
1.0000 mL | Freq: Once | INTRAMUSCULAR | Status: AC | PRN
  Administered 2017-10-02: 1 mL via EPIDURAL

## 2017-10-02 MED ORDER — TRIAMCINOLONE ACETONIDE 40 MG/ML IJ SUSP (RADIOLOGY)
60.0000 mg | Freq: Once | INTRAMUSCULAR | Status: AC
  Administered 2017-10-02: 60 mg via EPIDURAL

## 2017-10-02 NOTE — Discharge Instructions (Signed)

## 2017-10-15 ENCOUNTER — Ambulatory Visit: Payer: BLUE CROSS/BLUE SHIELD | Admitting: Sports Medicine

## 2017-10-15 ENCOUNTER — Ambulatory Visit: Payer: Self-pay

## 2017-10-15 ENCOUNTER — Encounter: Payer: Self-pay | Admitting: Sports Medicine

## 2017-10-15 VITALS — BP 130/70 | HR 81 | Ht 67.0 in | Wt 175.4 lb

## 2017-10-15 DIAGNOSIS — M79641 Pain in right hand: Secondary | ICD-10-CM

## 2017-10-15 DIAGNOSIS — M4722 Other spondylosis with radiculopathy, cervical region: Secondary | ICD-10-CM

## 2017-10-15 DIAGNOSIS — G587 Mononeuritis multiplex: Secondary | ICD-10-CM | POA: Insufficient documentation

## 2017-10-15 DIAGNOSIS — G5603 Carpal tunnel syndrome, bilateral upper limbs: Secondary | ICD-10-CM

## 2017-10-15 DIAGNOSIS — M79642 Pain in left hand: Secondary | ICD-10-CM | POA: Diagnosis not present

## 2017-10-15 NOTE — Progress Notes (Deleted)
error 

## 2017-10-15 NOTE — Progress Notes (Signed)
Bailey Berry. Bailey Berry, [place] at [place]  Bailey Berry - 57 y.o. female MRN [id number]  Date of birth: August 06, 1961  Visit Date: 10/15/2017  PCP: Adrian Prows, PA-C   Referred by: Adrian Prows, PA-C   Scribe for today's visit: Wendy Poet, LAT, ATC     SUBJECTIVE:  Bailey Berry is here for No chief complaint on file. .   Info from last visit on 08/04/17: Compared to the last office visit on 07/14/17, her previously described symptoms of B neck/upper trap pain and L arm pain show no change. Current symptoms are currently mild in nature while she's at relative rest but severe when she's attempting to grab a pan and lift something. She has not been using any medications currently but has tried both a Medrol dose pack and Gabapentin previously.  Follow-up:  Compared to the last office visit on 08/04/17, her previously described symptoms of B neck/upper trap and B arm pain are somewhat improved.  B shoulder pain has improved but con't to have N/T in B hands. Pain rated as mild in her B shoulders.  States that her B hands feel like they get stuck to a point where she can't fully open her hand and is getting a pulling-type pain in her B forearms.. Still is not using any medication at this point. Had a negative response to the 2nd epidural in that she had a stiff neck for about 24 hours.     ROS Denies night time disturbances. Denies fevers, chills, or night sweats. Denies unexplained weight loss. Denies personal history of cancer. Denies changes in bowel or bladder habits. Denies recent unreported falls. Reports new or worsening dyspnea or wheezing. Yes to SOB. Reports headaches or dizziness.  Some intermittent issues w/ dizziness. Reports numbness, tingling or weakness  In the extremities, in the B hands Denies dizziness or presyncopal episodes Denies lower extremity edema     HISTORY & PERTINENT PRIOR DATA:    Prior History reviewed and updated per electronic medical record.  Significant history, findings, studies and interim changes include:  reports that  has never smoked. she has never used smokeless tobacco. No results for input(s): HGBA1C, LABURIC, CREATINE in the last 8760 hours. No specialty comments available. Problem  Bilateral Carpal Tunnel Syndrome    OBJECTIVE:  VS:  HT:5\' 7"  (170.2 cm)   WT:175 lb 6.4 oz (79.6 kg)  BMI:27.47    BP:130/70  HR:81bpm  TEMP: ( )  RESP:    PHYSICAL EXAM: Constitutional: WDWN, Non-toxic appearing. Psychiatric: Alert & appropriately interactive.  Not depressed or anxious appearing. Respiratory: No increased work of breathing.  Trachea Midline Eyes: Pupils are equal.  EOM intact without nystagmus.  No scleral icterus  NEUROVASCULAR exam: No clubbing or cyanosis appreciated No significant venous stasis changes Capillary Refill: normal, less than 2 seconds   Bilateral hands overall well aligned.  No significant thenar atrophy.  Grip strength is intact.  Upper extremity myotomes are 5 out of 5 in all distributions bilaterally.  Upper extremity reflexes are symmetric.  Sensation is intact light touch.  She has a small amount of pain with Spurling's compression test and Lhermitte's compression test but this is minimal.  No significant radicular pain arm squeeze or brachial plexus squeeze test.  She has no exacerbating symptoms with Tinel's or Phalens  ASSESSMENT & PLAN:   1. Osteoarthritis of spine with radiculopathy, cervical region   2. Pain in both hands  3. Bilateral carpal tunnel syndrome   4. Double crush syndrome    ++++++++++++++++++++++++++++++++++++++++++++ Orders & Meds:  Orders Placed This Encounter  Procedures  . [place] MSK POCT ULTRASOUND   No orders of the defined types were placed in this encounter.   ++++++++++++++++++++++++++++++++++++++++++++ PLAN:   Findings:  Multifactorial symptoms with some component of double crush  but she has done quite well with epidural steroid injections would like to pursue the third epidural steroid in the series.  We will go ahead and get her set up for this and plan to follow-up with her in 8 weeks.  Can consider carpal tunnel injections if any persistent hand symptoms.   Bilateral carpal tunnel syndrome Mild bilaterally, most consistent with Double Crush Good improvement with neck component and reporting 90%.  She would like to repeat the 3rd epidural that had been discussed.     Follow-up: Return in about 8 weeks (around 12/10/2017).   Pertinent documentation may be included in additional procedure notes, imaging studies, problem based documentation and patient instructions. Please see these sections of the encounter for additional information regarding this visit. CMA/ATC served as Education administrator during this visit. History, Physical, and Plan performed by medical provider. Documentation and orders reviewed and attested to.      Gerda Diss, [place] [facility] Physician

## 2017-10-15 NOTE — Patient Instructions (Addendum)
[facility] will be calling you to schedule the third epidural.  I am glad that you have had is good improvement as you have.   Also check out [employer]" which is a program developed by Dr. Minerva Ends.   There are links to a couple of his YouTube Videos below and I would like to see performing one of his videos 5-6 days per week.    A good intro video is: "Independence from Pain 7-minute Video" - [email or web address]   His more advanced video is: "Powerful Posture and Pain Relief: 12 minutes of Foundation Training" - https://youtu.[email or web address]  Do not try to attempt this entire video when first beginning.    Try breaking of each exercise that he goes into shorter segments.  Otherwise if they perform an exercise for 45 seconds, start with 15 seconds and rest and then resume when they begin the new activity.    If you work your way up to doing this 12 minute video, I expect you will see significant improvements in your pain.  If you enjoy his videos and would like to find out more you can look on his website: [email or web address]  He has a workout streaming option as well as a DVD set available for purchase.  Amazon has the best price for his DVDs.

## 2017-10-15 NOTE — Procedures (Signed)
LIMITED MSK ULTRASOUND OF bilateral carpal tunnel Images were obtained and interpreted by myself, Teresa Coombs, DO  Images have been saved and stored to PACS system. Images obtained on: GE S7 Ultrasound machine  FINDINGS:   Bilateral median nerves are slightly swollen with slight fusiform type swelling measuring 0.12 cm bilaterally.  IMPRESSION:  1. Bilateral mild carpal tunnel syndrome based on MSK ultrasound findings

## 2017-10-15 NOTE — Assessment & Plan Note (Signed)
Mild bilaterally, most consistent with Double Crush Good improvement with neck component and reporting 90%.  She would like to repeat the 3rd epidural that had been discussed.

## 2017-10-20 ENCOUNTER — Other Ambulatory Visit: Payer: Self-pay | Admitting: Physical Therapy

## 2017-10-20 ENCOUNTER — Telehealth: Payer: Self-pay | Admitting: Sports Medicine

## 2017-10-20 DIAGNOSIS — M4722 Other spondylosis with radiculopathy, cervical region: Secondary | ICD-10-CM

## 2017-10-20 NOTE — Telephone Encounter (Signed)
Order placed for her c-spine epidural and pt called to confirm order.

## 2017-10-20 NOTE — Telephone Encounter (Signed)
Copied from [place]. Topic: Quick Communication - See Telephone Encounter >> Oct 20, 2017  9:55 AM Bennye Alm wrote: Patient called because Dr. Paulla Fore told her they would place an order to [facility] for her shot, but when she spoke to [facility] on Monday they said they didn't have any orders or anything for the patient. Patient was encouraged to call the office to find out what was going on. Please advise.

## 2017-10-20 NOTE — Telephone Encounter (Signed)
Please advise 

## 2017-10-23 ENCOUNTER — Telehealth: Payer: Self-pay | Admitting: Sports Medicine

## 2017-10-23 NOTE — Telephone Encounter (Signed)
Copied from [place] [phone]. Topic: Referral - Question >> Oct 23, 2017  1:52 PM Lolita Rieger, [place] wrote: Reason for CRM: Anderson Malta from [facility] would like a call back at [phone]

## 2017-10-23 NOTE — Telephone Encounter (Signed)
See note

## 2017-10-24 NOTE — Telephone Encounter (Signed)
Third epidural needs prior auth. CPT code [phone]. [employer] [phone]. Appt is 11/06/17.

## 2017-10-29 NOTE — Telephone Encounter (Signed)
Spoke with Lanny Hurst, he will make sure prior Josem Kaufmann is done prior to appointment.

## 2017-11-03 NOTE — Telephone Encounter (Signed)
Per [employer], clinical documentation is needed for review for prior auth. Faxed notes to [facility] review team at [phone].  Once notes have been reviewed, approval will be determined. If denied, another peer-to-peer will be required.

## 2017-11-03 NOTE — Telephone Encounter (Signed)
Noted  

## 2017-11-04 ENCOUNTER — Ambulatory Visit: Payer: BLUE CROSS/BLUE SHIELD | Admitting: Sports Medicine

## 2017-11-04 ENCOUNTER — Encounter: Payer: Self-pay | Admitting: Sports Medicine

## 2017-11-04 ENCOUNTER — Ambulatory Visit: Payer: Self-pay

## 2017-11-04 VITALS — BP 128/80 | HR 87 | Ht 67.0 in | Wt 173.8 lb

## 2017-11-04 DIAGNOSIS — M4722 Other spondylosis with radiculopathy, cervical region: Secondary | ICD-10-CM | POA: Diagnosis not present

## 2017-11-04 DIAGNOSIS — R609 Edema, unspecified: Secondary | ICD-10-CM

## 2017-11-04 DIAGNOSIS — N924 Excessive bleeding in the premenopausal period: Secondary | ICD-10-CM

## 2017-11-04 DIAGNOSIS — M79641 Pain in right hand: Secondary | ICD-10-CM | POA: Diagnosis not present

## 2017-11-04 DIAGNOSIS — G587 Mononeuritis multiplex: Secondary | ICD-10-CM | POA: Diagnosis not present

## 2017-11-04 DIAGNOSIS — M79642 Pain in left hand: Secondary | ICD-10-CM

## 2017-11-04 MED ORDER — CYCLOBENZAPRINE HCL 10 MG PO TABS: 10.0000 mg | ORAL_TABLET | Freq: Three times a day (TID) | ORAL | 1 refills | Status: DC | PRN

## 2017-11-04 NOTE — Telephone Encounter (Signed)
Pt calling to report a lump at the base of the back of her neck. She stated she had a cortisone shot and 3 days ago she felt the lump. Her friend looked at it and noted it was fluid filled. Denies pain or redness or itching. Appt made with Dr Paulla Fore today at 1:00 pm today.  Reason for Disposition . [1] Small swelling or lump AND [2] unexplained AND [3] present > 1 week  Answer Assessment - Initial Assessment Questions 1. APPEARANCE of SWELLING: "What does it look like?" (e.g., lymph node, insect bite, mole)     Fluid filled lump  2. SIZE: "How large is the swelling?" (inches, cm or compare to coins)    Unable to determine size of lump due to location 3. LOCATION: "Where is the swelling located?"     In the back at the base of her neck 4. ONSET: "When did the swelling start?"     3 days ago 5. PAIN: "Is it painful?" If so, ask: "How much?"     no 6. ITCH: "Does it itch?" If so, ask: "How much?"     no 7. CAUSE: "What do you think caused the swelling?"     Cortisone shot 8. OTHER SYMPTOMS: "Do you have any other symptoms?" (e.g., fever)     no  Protocols used: SKIN LUMP OR LOCALIZED SWELLING-A-AH

## 2017-11-04 NOTE — Assessment & Plan Note (Signed)
She is having some generalized paraspinal cervical spasms bilaterally right worse than left.  She would like to undergo the third epidural steroid injection in 2 weeks when she returns from an upcoming trip but we did discuss that if any lack of improvement or persistent ongoing neck and hand symptoms further d evaluation by neurosurgery will be indicated for consideration of intervention.  Will start her on a muscle relaxer and see how she responds.

## 2017-11-04 NOTE — Assessment & Plan Note (Signed)
There is a small degree of ultrasound findings consistent with carpal tunnel syndrome with measurement of the median nerve at approximately 0.12 cm which is consistent with mild to moderate carpal tunnel.  Her symptoms are improved from the past with the series of epidural steroid shots that she has had.  We did discuss carpal tunnel injections and/or electrodiagnostic studies but she would like to undergo the third epidural steroid that was offered by radiology.

## 2017-11-04 NOTE — Telephone Encounter (Signed)
All clinical documentation sent to [facility] for review.

## 2017-11-04 NOTE — Patient Instructions (Signed)
Try taking a muscle relaxer at night, approximately 1 hour prior to bed.  Please follow-up with your gynecologist as scheduled.  I suspect this is related to the steroid injections however is not something to ignore.

## 2017-11-04 NOTE — Progress Notes (Signed)
Bailey Berry. Bailey Berry, Bailey Berry at [place]  Bailey Berry - 57 y.o. female MRN [id number]  Date of birth: 29-Sep-1960  Visit Date: 11/04/2017  PCP: Bailey Prows, PA-C   Referred by: Bailey Prows, PA-C   Scribe for today's visit: Wendy Poet, LAT, ATC     SUBJECTIVE:  Bailey Berry is here for Follow-up (lump at the back of her neck) .   Notes from OV on 10/15/17: Compared to the last office visit on 08/04/17, her previously described symptoms of B neck/upper trap and B arm pain are somewhat improved.  B shoulder pain has improved but con't to have N/T in B hands. Pain rated as mild in her B shoulders.  States that her B hands feel like they get stuck to a point where she can't fully open her hand and is getting a pulling-type pain in her B forearms.. Still is not using any medication at this point. Had a negative response to the 2nd epidural in that she had a stiff neck for about 24 hours.   Compared to the last office visit on 10/15/17, her previously described symptoms are different w/ her describing a lump/bump at her C/T junction that started about 3 days ago that she describes as increased tension/pressure w/ attempts at cervical flexion. Current symptoms are 4/10 and 0/10 at rest & are nonradiating She has not been using any medication or ice/heat.     ROS Denies night time disturbances. Denies fevers, chills, or night sweats. Denies unexplained weight loss. Denies personal history of cancer. Denies changes in bowel or bladder habits. Denies recent unreported falls. Denies new or worsening dyspnea or wheezing. Denies headaches or dizziness.  Reports numbness, tingling or weakness  In the extremities - B hands Denies dizziness or presyncopal episodes Denies lower extremity edema     HISTORY & PERTINENT PRIOR DATA:  Prior History reviewed and updated per electronic medical record.  Significant  history, findings, studies and interim changes include:  reports that  has never smoked. she has never used smokeless tobacco. No results for input(s): HGBA1C, LABURIC, CREATINE in the last 8760 hours. No specialty comments available. Problem  Double Crush Syndrome  Osteoarthritis of Spine With Radiculopathy, Cervical Region   MRI Cervical Spine: 1. Severe right foraminal stenosis at C5-6 secondary to asymmetric uncovertebral and facet disease. 2. Moderate right central canal narrowing at C5-6 with some distortion of the right-sided cord but no abnormal cord signal. 3. Moderate central and bilateral foraminal stenosis at C6-7 secondary to a broad-based disc osteophyte complex and bilateral uncovertebral spurring. 4. Mild right foraminal narrowing at C4-5. 5. Mild central and bilateral foraminal narrowing at C3-4.     OBJECTIVE:  VS:  HT:5\' 7"  (170.2 cm)   WT:173 lb 12.8 oz (78.8 kg)  BMI:27.21    BP:128/80  HR:87bpm  TEMP: ( )  RESP:99 %   PHYSICAL EXAM: Constitutional: WDWN, Non-toxic appearing. Psychiatric: Alert & appropriately interactive.  Not depressed or anxious appearing. Respiratory: No increased work of breathing.  Trachea Midline Eyes: Pupils are equal.  EOM intact without nystagmus.  No scleral icterus  NEUROVASCULAR exam: No clubbing or cyanosis appreciated No significant venous stasis changes Capillary Refill: normal, less than 2 seconds   Small amount of pain with palpation of bilateral paraspinal musculature but this is minimal.  Overall cervical range of motion has improved.  Grip strength is intact.  She has soft tissue prominence over the posterior aspect  of the neck that is consistent with a dowager hump.   ASSESSMENT & PLAN:   1. Osteoarthritis of spine with radiculopathy, cervical region   2. Pain in both hands   3. Soft tissue swelling of back   4. Abnormal perimenopausal bleeding   5. Double crush syndrome     ++++++++++++++++++++++++++++++++++++++++++++ Orders & Meds: No orders of the defined types were placed in this encounter.   Meds ordered this encounter  Medications  . cyclobenzaprine (FLEXERIL) 10 MG tablet    Sig: Take 1 tablet (10 mg total) by mouth 3 (three) times daily as needed for muscle spasms.    Dispense:  30 tablet    Refill:  1    ++++++++++++++++++++++++++++++++++++++++++++ PLAN:   Findings:  Ultimately the patient has had a slight enlargement of the dowagers hump following the corticosteroid injections that she has had.  I also think that the menstrual bleeding that she is having is likely related to corticosteroid use.  She went 8 months with no menses prior to December but since that time is 3 menstrual periods with most recent being reported as having 12 days of bleeding.  This is perimenopausal in nature versus postmenopausal and further diagnostic evaluation with ultrasound of the endometrium could be considered but I will defer this to her gynecologist who she is seeing in 2 weeks.  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   Osteoarthritis of spine with radiculopathy, cervical region She is having some generalized paraspinal cervical spasms bilaterally right worse than left.  She would like to undergo the third epidural steroid injection in 2 weeks when she returns from an upcoming trip but we did discuss that if any lack of improvement or persistent ongoing neck and hand symptoms further d evaluation by neurosurgery will be indicated for consideration of intervention.  Will start her on a muscle relaxer and see how she responds.  Double crush syndrome There is a small degree of ultrasound findings consistent with carpal tunnel syndrome with measurement of the median nerve at approximately 0.12 cm which is consistent with mild to moderate carpal tunnel.  Her symptoms are improved from the past with the series of epidural steroid shots  that she has had.  We did discuss carpal tunnel injections and/or electrodiagnostic studies but she would like to undergo the third epidural steroid that was offered by radiology.   Follow-up: Return in about 6 weeks (around 12/15/2017) for follow-up visit as previously scheduled.   Pertinent documentation may be included in additional procedure notes, imaging studies, problem based documentation and patient instructions. Please see these sections of the encounter for additional information regarding this visit. CMA/ATC served as Education administrator during this visit. History, Physical, and Plan performed by medical provider. Documentation and orders reviewed and attested to.      Gerda Diss, [place] [facility] Physician

## 2017-11-06 ENCOUNTER — Other Ambulatory Visit: Payer: BLUE CROSS/BLUE SHIELD

## 2017-11-19 NOTE — Telephone Encounter (Signed)
Caller Name: Sharyn Lull with [facility] Phone: [phone] 11/19/17 10:35am [place] time zone  Order# [id number] valid 11/19/2017-12/02/2017 CPT 33744

## 2017-11-19 NOTE — Telephone Encounter (Signed)
See note

## 2017-11-19 NOTE — Telephone Encounter (Signed)
Spoke with [facility], PA# placed in pt appt note/reason.

## 2017-11-20 ENCOUNTER — Ambulatory Visit
Admission: RE | Admit: 2017-11-20 | Discharge: 2017-11-20 | Disposition: A | Discharge status: Home / Self Care | Payer: BLUE CROSS/BLUE SHIELD | Source: Ambulatory Visit | Attending: Sports Medicine | Admitting: Sports Medicine

## 2017-11-20 DIAGNOSIS — M4722 Other spondylosis with radiculopathy, cervical region: Secondary | ICD-10-CM

## 2017-11-20 MED ORDER — TRIAMCINOLONE ACETONIDE 40 MG/ML IJ SUSP (RADIOLOGY)
60.0000 mg | Freq: Once | INTRAMUSCULAR | Status: AC
  Administered 2017-11-20: 60 mg via EPIDURAL

## 2017-11-20 MED ORDER — IOPAMIDOL (ISOVUE-M 300) INJECTION 61%
1.0000 mL | Freq: Once | INTRAMUSCULAR | Status: AC | PRN
  Administered 2017-11-20: 1 mL via EPIDURAL

## 2017-11-20 NOTE — Discharge Instructions (Signed)

## 2017-12-15 ENCOUNTER — Encounter: Payer: Self-pay | Admitting: Sports Medicine

## 2017-12-15 ENCOUNTER — Ambulatory Visit: Payer: BLUE CROSS/BLUE SHIELD | Admitting: Sports Medicine

## 2017-12-15 VITALS — BP 122/82 | HR 80 | Ht 67.0 in | Wt 177.0 lb

## 2017-12-15 DIAGNOSIS — G5603 Carpal tunnel syndrome, bilateral upper limbs: Secondary | ICD-10-CM | POA: Diagnosis not present

## 2017-12-15 DIAGNOSIS — M79641 Pain in right hand: Secondary | ICD-10-CM | POA: Diagnosis not present

## 2017-12-15 DIAGNOSIS — G587 Mononeuritis multiplex: Secondary | ICD-10-CM

## 2017-12-15 DIAGNOSIS — M4722 Other spondylosis with radiculopathy, cervical region: Secondary | ICD-10-CM

## 2017-12-15 DIAGNOSIS — M79642 Pain in left hand: Secondary | ICD-10-CM

## 2017-12-15 NOTE — Progress Notes (Signed)
Bailey Berry. [name], [place] [facility] [place]  Bailey Berry - 57 y.o. female MRN [id number]  Date of birth: 1961-05-24  Visit Date: 12/15/2017  PCP: Adrian Prows, PA-C   Referred by: Adrian Prows, PA-C  Scribe for today's visit: Josepha Pigg, CMA     SUBJECTIVE:  Bailey Berry is here for Follow-up (neck pain, bilateral hand pain)  Notes from [place] on 10/15/17: Compared to the last office visit on 08/04/17, her previously described symptoms of B neck/upper trap and B arm pain are somewhat improved.  B shoulder pain has improved but con't to have N/T in B hands. Pain rated as mild in her B shoulders.  States that her B hands feel like they get stuck to a point where she can't fully open her hand and is getting a pulling-type pain in her B forearms.. Still is not using any medication at this point. Had a negative response to the 2nd epidural in that she had a stiff neck for about 24 hours.   Compared to the last office visit on 10/15/17, her previously described symptoms are different w/ her describing a lump/bump at her C/T junction that started about 3 days ago that she describes as increased tension/pressure w/ attempts at cervical flexion. Current symptoms are 4/10 and 0/10 at rest & are nonradiating She has not been using any medication or ice/heat.  12/15/2017: Compared to the last office visit, her previously described symptoms are improving. Neck pain has improved. She continues to have pain and stiffness in both hands. Hands get "stuck" when she is working with her hands. 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 Pain comes and goes.  Current symptoms are moderate & are radiating to the lower arms, but not past the elbow.  She doesn't take any medications for the pain because it comes and goes pretty quickly.   ROS Denies night time disturbances. Denies fevers, chills, or night sweats. Denies  unexplained weight loss. Reports personal history of cancer. Denies changes in bowel or bladder habits. Denies recent unreported falls. Denies new or worsening dyspnea or wheezing. Denies headaches or dizziness.  Reports numbness, tingling or weakness  In the extremities.  Denies dizziness or presyncopal episodes Denies lower extremity edema    HISTORY & PERTINENT PRIOR DATA:  Prior History reviewed and updated per electronic medical record.  Significant/pertinent history, findings, studies include:  reports that she has never smoked. She has never used smokeless tobacco. No results for input(s): HGBA1C, LABURIC, CREATINE in the last 8760 hours. No specialty comments available. No problems updated.  OBJECTIVE:  VS:  HT:5\' 7"  (170.2 cm)   WT:177 lb (80.3 kg)  BMI:27.72    BP:122/82  HR:80bpm  TEMP: ( )  RESP:99 %   PHYSICAL EXAM: Constitutional: WDWN, Non-toxic appearing. Psychiatric: Alert & appropriately interactive.  Not depressed or anxious appearing. Respiratory: No increased work of breathing.  Trachea Midline Eyes: Pupils are equal.  EOM intact without nystagmus.  No scleral icterus  Vascular Exam: warm to touch no edema  upper extremity neuro exam: Patient has multifactorial symptoms.  She has pain carpal tunnel compression test bilaterally.  Pain Spurling's compression test pain with arm squeeze testing pain with brachial plexus squeeze.  She does have slightly decreased grip strength as well as wrist extension.   ASSESSMENT & PLAN:   1. Osteoarthritis of spine with radiculopathy, cervical region   2. Pain in both hands  3. Double crush syndrome   4. Bilateral carpal tunnel syndrome     PLAN: Given ongoing symptoms further evaluation with nerve testing testing is recommended at this time and referral placed.  Follow-up after this is obtained to discuss further diagnostic and therapeutic interventions.  She is status post epidural steroid injections x3 and has  had good improvements in her neck pain but continues to have bilateral hand symptoms.  I can consider operative versus circumferential Hydro dissection of the median nerve if nerve conduction testing is consistent with mild to moderate.  Follow-up: Return for NCV testing results review.     Please see additional documentation for Objective, Assessment and Plan sections. Pertinent additional documentation may be included in corresponding procedure notes, imaging studies, problem based documentation and patient instructions. Please see these sections of the encounter for additional information regarding this visit.  CMA/ATC served as Education administrator during this visit. History, Physical, and Plan performed by medical provider. Documentation and orders reviewed and attested to.      Gerda Diss, [place] [facility] Physician

## 2018-01-15 ENCOUNTER — Encounter (INDEPENDENT_AMBULATORY_CARE_PROVIDER_SITE_OTHER): Payer: Self-pay | Admitting: Physical Medicine and Rehabilitation

## 2018-01-15 ENCOUNTER — Encounter: Payer: Self-pay | Admitting: Sports Medicine

## 2018-01-15 ENCOUNTER — Ambulatory Visit (INDEPENDENT_AMBULATORY_CARE_PROVIDER_SITE_OTHER): Payer: BLUE CROSS/BLUE SHIELD | Admitting: Physical Medicine and Rehabilitation

## 2018-01-15 DIAGNOSIS — R202 Paresthesia of skin: Secondary | ICD-10-CM | POA: Diagnosis not present

## 2018-01-15 NOTE — Progress Notes (Signed)
.   .  Numeric Pain Rating Scale and Functional Assessment Average Pain 3   In the last MONTH (on 0-10 scale) has pain interfered with the following?  1. General activity like being  able to carry out your everyday physical activities such as walking, climbing stairs, carrying groceries, or moving a chair?  Rating(5)     

## 2018-01-16 NOTE — Progress Notes (Signed)
Bailey Berry - 57 y.o. female MRN [id number]  Date of birth: 08-11-1961  Office Visit Note: Visit Date: 01/15/2018 PCP: Adrian Prows, PA-C Referred by: Adrian Prows, PA-C  Subjective: Chief Complaint  Patient presents with  . Neck - Pain  . Right Hand - Weakness  . Left Hand - Weakness   HPI: Bailey Berry is a 57 year old right-hand-dominant female that comes in today at the request of Dr. Teresa Coombs for bilateral upper extremity electrodiagnostic study.  She reports bilateral neck and shoulder pain and arm pain with referral into the hands that started last summer without specific injury.  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  She reports a pulling sensation in the right and left arms in both hands where she feels like her hands will get stuck in a contracted position almost like a cramping position.  She reports worsening symptoms with trying to grab and hold an open objects.  She had originally been a patient at [facility] orthopedic and then follow Dr. Paulla Fore to his new practice.  He initially treated with medication and Medrol Dosepak that she said helped temporarily.  She went on to have an MRI of the cervical spine which is reviewed below.  She does have significant central canal narrowing at C6-7 and more to the right at C5-6 and there is some foraminal narrowing.  She actually underwent a "series of 3 "epidural injections at [facility] imaging of the cervical spine.  At least for a month the injections gave her pretty significant relief.  She reports that the injections did not last long enough and she does not want to go through life having injections.  She reports that the injections did seem to relieve even the cramping sensation of both arms and hands.  Dr. Paulla Fore did obtain ultrasound imaging of the wrist that showed mildly increased diameter of the median nerve, 0.12.  She has not had prior electrodiagnostic studies.  She does not really  endorse much in the way of numbness or tingling more of this cramping pain sensation and sort of loss of use of the hands when it happens.  She reports that both hands and arms are very much equal and not necessarily a particular distribution in the fingers.   ROS Otherwise per HPI.  Assessment & Plan: Visit Diagnoses:  1. Paresthesia of skin     Plan: No additional findings.  Impression: The above electrodiagnostic study is ABNORMAL and reveals evidence of a borderline to mild right median nerve entrapment at the wrist affecting sensory components.  This would not explain her symptoms particularly given the fact that both hands are equally affected and she does get some symptoms in the arms.  Given the fact that the epidural injections of the cervical spine were very beneficial for a month I think that is probably more diagnostic that this is more of a cervical spine issue.  There is no significant electrodiagnostic evidence of any other focal nerve entrapment, brachial plexopathy or cervical radiculopathy.    As you know, this particular electrodiagnostic study cannot rule out chemical radiculitis or sensory only radiculopathy.  Recommendations: 1.  Follow-up with referring physician. 2.  Continue current management of symptoms.  Could entertain the idea of carpal tunnel injection diagnostically.  Patient did get good relief although short-term with epidural steroid of the cervical spine.     Meds & Orders: No orders of the defined types were placed in  this encounter.   Orders Placed This Encounter  Procedures  . NCV with EMG (electromyography)    Follow-up: Return for Dr. Paulla Fore as scheduled.   Procedures: No procedures performed  EMG & NCV Findings: Evaluation of the right median (across palm) sensory nerve showed prolonged distal peak latency (Wrist, 3.8 ms).  All remaining nerves (as indicated in the following tables) were within normal limits.  Left vs. Right side comparison  data for the ulnar motor nerve indicates abnormal L-R velocity difference (B Elbow-Wrist, 10 m/s) and abnormal L-R velocity difference (A Elbow-B Elbow, 24 m/s).  All remaining left vs. right side differences were within normal limits.  *The left to right velocity difference of the ulnar nerve is technical artifact  All examined muscles (as indicated in the following table) showed no evidence of electrical instability.    Impression: The above electrodiagnostic study is ABNORMAL and reveals evidence of a borderline to mild right median nerve entrapment at the wrist affecting sensory components.  This would not explain her symptoms particularly given the fact that both hands are equally affected and she does get some symptoms in the arms.  Given the fact that the epidural injections of the cervical spine were very beneficial for a month I think that is probably more diagnostic that this is more of a cervical spine issue.  There is no significant electrodiagnostic evidence of any other focal nerve entrapment, brachial plexopathy or cervical radiculopathy.    As you know, this particular electrodiagnostic study cannot rule out chemical radiculitis or sensory only radiculopathy.  Recommendations: 1.  Follow-up with referring physician. 2.  Continue current management of symptoms.  Could entertain the idea of carpal tunnel injection diagnostically.  Patient did get good relief although short-term with epidural steroid of the cervical spine.   Nerve Conduction Studies Anti Sensory Summary Table   Stim Site NR Peak (ms) Norm Peak (ms) P-T Amp (V) Norm P-T Amp Site1 Site2 Delta-P (ms) Dist (cm) Vel (m/s) Norm Vel (m/s)  Left Median Acr Palm Anti Sensory (2nd Digit)  31.8C  Wrist    3.1 <3.6 27.0 >10 Wrist Palm 1.5 0.0    Palm    1.6 <2.0 39.2         Right Median Acr Palm Anti Sensory (2nd Digit)  32.4C  Wrist    *3.8 <3.6 27.9 >10 Wrist Palm 1.9 0.0    Palm    1.9 <2.0 28.5         Left Radial  Anti Sensory (Base 1st Digit)  31.8C  Wrist    2.3 <3.1 31.3  Wrist Base 1st Digit 2.3 0.0    Right Radial Anti Sensory (Base 1st Digit)  32.6C  Wrist    2.2 <3.1 37.4  Wrist Base 1st Digit 2.2 0.0    Left Ulnar Anti Sensory (5th Digit)  32C  Wrist    3.4 <3.7 15.8 >15.0 Wrist 5th Digit 3.4 14.0 41 >38  Right Ulnar Anti Sensory (5th Digit)  33C  Wrist    3.3 <3.7 24.2 >15.0 Wrist 5th Digit 3.3 14.0 42 >38   Motor Summary Table   Stim Site NR Onset (ms) Norm Onset (ms) O-P Amp (mV) Norm O-P Amp Site1 Site2 Delta-0 (ms) Dist (cm) Vel (m/s) Norm Vel (m/s)  Left Median Motor (Abd Poll Brev)  31.7C  Wrist    3.4 <4.2 6.2 >5 Elbow Wrist 3.9 22.0 56 >50  Elbow    7.3  6.4  Right Median Motor (Abd Poll Brev)  32.1C  Wrist    3.6 <4.2 10.6 >5 Elbow Wrist 4.1 22.0 54 >50  Elbow    7.7  10.3         Left Ulnar Motor (Abd Dig Min)  31.8C  Wrist    2.9 <4.2 9.7 >3 B Elbow Wrist 2.9 19.5 67 >53  B Elbow    5.8  9.9  A Elbow B Elbow 1.7 9.0 53 >53  A Elbow    7.5  9.7         Right Ulnar Motor (Abd Dig Min)  31.7C  Wrist    2.9 <4.2 9.4 >3 B Elbow Wrist 3.4 19.5 57 >53  B Elbow    6.3  10.0  A Elbow B Elbow 1.3 10.0 77 >53  A Elbow    7.6  9.9          EMG   Side Muscle Nerve Root Ins Act Fibs Psw Amp Dur Poly Recrt Int Fraser Din Comment  Left 1stDorInt Ulnar C8-T1 Nml Nml Nml Nml Nml 0 Nml Nml   Left Abd Poll Brev Median C8-T1 Nml Nml Nml Nml Nml 0 Nml Nml   Left ExtDigCom   Nml Nml Nml Nml Nml 0 Nml Nml   Left Triceps Radial C6-7-8 Nml Nml Nml Nml Nml 0 Nml Nml   Left Deltoid Axillary C5-6 Nml Nml Nml Nml Nml 0 Nml Nml     Nerve Conduction Studies Anti Sensory Left/Right Comparison   Stim Site L Lat (ms) R Lat (ms) L-R Lat (ms) L Amp (V) R Amp (V) L-R Amp (%) Site1 Site2 L Vel (m/s) R Vel (m/s) L-R Vel (m/s)  Median Acr Palm Anti Sensory (2nd Digit)  31.8C  Wrist 3.1 *3.8 0.7 27.0 27.9 3.2 Wrist Palm     Palm 1.6 1.9 0.3 39.2 28.5 27.3       Radial Anti Sensory (Base 1st  Digit)  31.8C  Wrist 2.3 2.2 0.1 31.3 37.4 16.3 Wrist Base 1st Digit     Ulnar Anti Sensory (5th Digit)  32C  Wrist 3.4 3.3 0.1 15.8 24.2 34.7 Wrist 5th Digit 41 42 1   Motor Left/Right Comparison   Stim Site L Lat (ms) R Lat (ms) L-R Lat (ms) L Amp (mV) R Amp (mV) L-R Amp (%) Site1 Site2 L Vel (m/s) R Vel (m/s) L-R Vel (m/s)  Median Motor (Abd Poll Brev)  31.7C  Wrist 3.4 3.6 0.2 6.2 10.6 41.5 Elbow Wrist 56 54 2  Elbow 7.3 7.7 0.4 6.4 10.3 37.9       Ulnar Motor (Abd Dig Min)  31.8C  Wrist 2.9 2.9 0.0 9.7 9.4 3.1 B Elbow Wrist 67 57 *10  B Elbow 5.8 6.3 0.5 9.9 10.0 1.0 A Elbow B Elbow 53 77 *24  A Elbow 7.5 7.6 0.1 9.7 9.9 2.0          Waveforms:                     Clinical History: MRI CERVICAL SPINE WITHOUT CONTRAST  TECHNIQUE: Multiplanar, multisequence MR imaging of the cervical spine was performed. No intravenous contrast was administered.  COMPARISON:  Cervical spine radiographs 06/16/2017  FINDINGS: Alignment: Slight retrolisthesis is present at C6-7. AP alignment is otherwise anatomic.  Vertebrae: Edematous endplate marrow changes are present at C6-7. Marrow signal and vertebral body heights are otherwise normal.  Cord: Normal signal is present in the cervical and upper thoracic spinal  cord to the lowest imaged level, T3-4.  Posterior Fossa, vertebral arteries, paraspinal tissues: The craniocervical junction is normal. Fluid is noted in the left sphenoid sinus. Visualized intracranial contents are normal. Flow is present in the vertebral artery is bilaterally.  Disc levels:  C2-3: Negative.  C3-4: A broad-based disc osteophyte complex partially effaces the ventral CSF. Uncovertebral spurring contributes to mild bilateral foraminal narrowing.  C4-5: Mild right foraminal narrowing is secondary to mild uncovertebral and facet hypertrophy.  C5-6: A rightward disc osteophyte complex is present. Asymmetric uncovertebral and facet  disease results in severe right foraminal stenosis. There is effacement of the CSF on the right with slight distortion of the right side of the spinal cord but no abnormal signal.  C6-7: A broad-based disc osteophyte complex is present. Uncovertebral spurring is worse on the left. Moderate foraminal stenosis is present bilaterally. There is effacement of ventral CSF.  C7-T1:  Negative.  IMPRESSION: 1. Severe right foraminal stenosis at C5-6 secondary to asymmetric uncovertebral and facet disease. 2. Moderate right central canal narrowing at C5-6 with some distortion of the right-sided cord but no abnormal cord signal. 3. Moderate central and bilateral foraminal stenosis at C6-7 secondary to a broad-based disc osteophyte complex and bilateral uncovertebral spurring. 4. Mild right foraminal narrowing at C4-5. 5. Mild central and bilateral foraminal narrowing at C3-4.   Electronically Signed   By: San Morelle M.D.   On: 07/27/2017 11:24   She reports that she has never smoked. She has never used smokeless tobacco. No results for input(s): HGBA1C, LABURIC in the last 8760 hours.  Objective:  VS:  HT:    WT:   BMI:     BP:   HR: bpm  TEMP: ( )  RESP:  Physical Exam  Musculoskeletal:  Inspection reveals no atrophy of the bilateral APB or FDI or hand intrinsics. There is no swelling, color changes, allodynia or dystrophic changes. There is 5 out of 5 strength in the bilateral wrist extension, finger abduction and long finger flexion. There is intact sensation to light touch in all dermatomal and peripheral nerve distributions. There is a negative Tinel's test at the bilateral wrist and elbow. There is a negative Hoffmann's test bilaterally.    Ortho Exam Imaging: No results found.  Past Medical/Family/Surgical/Social History: Medications & Allergies reviewed per EMR, new medications updated. Patient Active Problem List   Diagnosis Date Noted  . Bilateral carpal  tunnel syndrome 10/15/2017  . Double crush syndrome 10/15/2017  . Osteoarthritis of spine with radiculopathy, cervical region 06/16/2017  . Ductal carcinoma in situ (DCIS) of right breast 12/06/2014   History reviewed. No pertinent past medical history. History reviewed. No pertinent family history. History reviewed. No pertinent surgical history. Social History   Occupational History  . Not on file  Tobacco Use  . Smoking status: Never Smoker  . Smokeless tobacco: Never Used  Substance and Sexual Activity  . Alcohol use: Not on file  . Drug use: Not on file  . Sexual activity: Not on file

## 2018-01-16 NOTE — Procedures (Signed)
EMG & NCV Findings: Evaluation of the right median (across palm) sensory nerve showed prolonged distal peak latency (Wrist, 3.8 ms).  All remaining nerves (as indicated in the following tables) were within normal limits.  Left vs. Right side comparison data for the ulnar motor nerve indicates abnormal L-R velocity difference (B Elbow-Wrist, 10 m/s) and abnormal L-R velocity difference (A Elbow-B Elbow, 24 m/s).  All remaining left vs. right side differences were within normal limits.  *The left to right velocity difference of the ulnar nerve is technical artifact  All examined muscles (as indicated in the following table) showed no evidence of electrical instability.    Impression: The above electrodiagnostic study is ABNORMAL and reveals evidence of a borderline to mild right median nerve entrapment at the wrist affecting sensory components.  This would not explain her symptoms particularly given the fact that both hands are equally affected and she does get some symptoms in the arms.  Given the fact that the epidural injections of the cervical spine were very beneficial for a month I think that is probably more diagnostic that this is more of a cervical spine issue.  There is no significant electrodiagnostic evidence of any other focal nerve entrapment, brachial plexopathy or cervical radiculopathy.    As you know, this particular electrodiagnostic study cannot rule out chemical radiculitis or sensory only radiculopathy.  Recommendations: 1.  Follow-up with referring physician. 2.  Continue current management of symptoms.  Could entertain the idea of carpal tunnel injection diagnostically.  Patient did get good relief although short-term with epidural steroid of the cervical spine.   Nerve Conduction Studies Anti Sensory Summary Table   Stim Site NR Peak (ms) Norm Peak (ms) P-T Amp (V) Norm P-T Amp Site1 Site2 Delta-P (ms) Dist (cm) Vel (m/s) Norm Vel (m/s)  Left Median Acr Palm Anti Sensory  (2nd Digit)  31.8C  Wrist    3.1 <3.6 27.0 >10 Wrist Palm 1.5 0.0    Palm    1.6 <2.0 39.2         Right Median Acr Palm Anti Sensory (2nd Digit)  32.4C  Wrist    *3.8 <3.6 27.9 >10 Wrist Palm 1.9 0.0    Palm    1.9 <2.0 28.5         Left Radial Anti Sensory (Base 1st Digit)  31.8C  Wrist    2.3 <3.1 31.3  Wrist Base 1st Digit 2.3 0.0    Right Radial Anti Sensory (Base 1st Digit)  32.6C  Wrist    2.2 <3.1 37.4  Wrist Base 1st Digit 2.2 0.0    Left Ulnar Anti Sensory (5th Digit)  32C  Wrist    3.4 <3.7 15.8 >15.0 Wrist 5th Digit 3.4 14.0 41 >38  Right Ulnar Anti Sensory (5th Digit)  33C  Wrist    3.3 <3.7 24.2 >15.0 Wrist 5th Digit 3.3 14.0 42 >38   Motor Summary Table   Stim Site NR Onset (ms) Norm Onset (ms) O-P Amp (mV) Norm O-P Amp Site1 Site2 Delta-0 (ms) Dist (cm) Vel (m/s) Norm Vel (m/s)  Left Median Motor (Abd Poll Brev)  31.7C  Wrist    3.4 <4.2 6.2 >5 Elbow Wrist 3.9 22.0 56 >50  Elbow    7.3  6.4         Right Median Motor (Abd Poll Brev)  32.1C  Wrist    3.6 <4.2 10.6 >5 Elbow Wrist 4.1 22.0 54 >50  Elbow    7.7  10.3  Left Ulnar Motor (Abd Dig Min)  31.8C  Wrist    2.9 <4.2 9.7 >3 B Elbow Wrist 2.9 19.5 67 >53  B Elbow    5.8  9.9  A Elbow B Elbow 1.7 9.0 53 >53  A Elbow    7.5  9.7         Right Ulnar Motor (Abd Dig Min)  31.7C  Wrist    2.9 <4.2 9.4 >3 B Elbow Wrist 3.4 19.5 57 >53  B Elbow    6.3  10.0  A Elbow B Elbow 1.3 10.0 77 >53  A Elbow    7.6  9.9          EMG   Side Muscle Nerve Root Ins Act Fibs Psw Amp Dur Poly Recrt Int Fraser Din Comment  Left 1stDorInt Ulnar C8-T1 Nml Nml Nml Nml Nml 0 Nml Nml   Left Abd Poll Brev Median C8-T1 Nml Nml Nml Nml Nml 0 Nml Nml   Left ExtDigCom   Nml Nml Nml Nml Nml 0 Nml Nml   Left Triceps Radial C6-7-8 Nml Nml Nml Nml Nml 0 Nml Nml   Left Deltoid Axillary C5-6 Nml Nml Nml Nml Nml 0 Nml Nml     Nerve Conduction Studies Anti Sensory Left/Right Comparison   Stim Site L Lat (ms) R Lat (ms) L-R Lat (ms) L  Amp (V) R Amp (V) L-R Amp (%) Site1 Site2 L Vel (m/s) R Vel (m/s) L-R Vel (m/s)  Median Acr Palm Anti Sensory (2nd Digit)  31.8C  Wrist 3.1 *3.8 0.7 27.0 27.9 3.2 Wrist Palm     Palm 1.6 1.9 0.3 39.2 28.5 27.3       Radial Anti Sensory (Base 1st Digit)  31.8C  Wrist 2.3 2.2 0.1 31.3 37.4 16.3 Wrist Base 1st Digit     Ulnar Anti Sensory (5th Digit)  32C  Wrist 3.4 3.3 0.1 15.8 24.2 34.7 Wrist 5th Digit 41 42 1   Motor Left/Right Comparison   Stim Site L Lat (ms) R Lat (ms) L-R Lat (ms) L Amp (mV) R Amp (mV) L-R Amp (%) Site1 Site2 L Vel (m/s) R Vel (m/s) L-R Vel (m/s)  Median Motor (Abd Poll Brev)  31.7C  Wrist 3.4 3.6 0.2 6.2 10.6 41.5 Elbow Wrist 56 54 2  Elbow 7.3 7.7 0.4 6.4 10.3 37.9       Ulnar Motor (Abd Dig Min)  31.8C  Wrist 2.9 2.9 0.0 9.7 9.4 3.1 B Elbow Wrist 67 57 *10  B Elbow 5.8 6.3 0.5 9.9 10.0 1.0 A Elbow B Elbow 53 77 *24  A Elbow 7.5 7.6 0.1 9.7 9.9 2.0          Waveforms:

## 2018-01-19 ENCOUNTER — Ambulatory Visit: Payer: BLUE CROSS/BLUE SHIELD | Admitting: Sports Medicine

## 2018-01-20 ENCOUNTER — Encounter: Payer: Self-pay | Admitting: Sports Medicine

## 2018-01-20 ENCOUNTER — Ambulatory Visit: Payer: BLUE CROSS/BLUE SHIELD | Admitting: Sports Medicine

## 2018-01-20 VITALS — BP 126/84 | HR 97 | Ht 67.0 in | Wt 174.8 lb

## 2018-01-20 DIAGNOSIS — G587 Mononeuritis multiplex: Secondary | ICD-10-CM | POA: Diagnosis not present

## 2018-01-20 DIAGNOSIS — G5603 Carpal tunnel syndrome, bilateral upper limbs: Secondary | ICD-10-CM

## 2018-01-20 DIAGNOSIS — M4722 Other spondylosis with radiculopathy, cervical region: Secondary | ICD-10-CM | POA: Diagnosis not present

## 2018-01-20 NOTE — Progress Notes (Signed)
Bailey Berry. Bailey Berry, [facility]  Bailey Berry - 57 y.o. female MRN [id number]  Date of birth: 1961/04/20  Visit Date: 01/20/2018  PCP: Adrian Prows, PA-C   Referred by: Adrian Prows, PA-C  Scribe for today's visit: Wendy Poet, LAT, ATC     SUBJECTIVE:  Bailey Berry is here for Follow-up (neck pain w/ radiculopathy) .   Notes from OV on 10/15/17: Compared to the last office visit on 08/04/17, her previously described symptoms of B neck/upper trap and B arm pain are somewhat improved.  B shoulder pain has improved but con't to have N/T in B hands. Pain rated as mild in her B shoulders.  States that her B hands feel like they get stuck to a point where she can't fully open her hand and is getting a pulling-type pain in her B forearms.. Still is not using any medication at this point. Had a negative response to the 2nd epidural in that she had a stiff neck for about 24 hours.   Compared to the last office visit on 10/15/17, her previously described symptoms are different w/ her describing a lump/bump at her C/T junction that started about 3 days ago that she describes as increased tension/pressure w/ attempts at cervical flexion. Current symptoms are 4/10 and 0/10 at rest & are nonradiating She has not been using any medication or ice/heat.  12/15/2017: Compared to the last office visit, her previously described symptoms are improving. Neck pain has improved. She continues to have pain and stiffness in both hands. Hands get "stuck" when she is working with her hands. 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 Pain comes and goes.  Current symptoms are moderate & are radiating to the lower arms, but not past the elbow.  She doesn't take any medications for the pain because it comes and goes pretty quickly.   01/20/18: Compared to the last office visit on 12/15/17, her previously described  neck pain and UE symptoms are worsening w/ increased neck pain and L UE pain, radiating to the L wrist. Current symptoms are mild-severe.  Today the pain is a 1/10. & are radiating to the L UE. She has not been taking any medications for her current symptoms.  ROS Denies night time disturbances. Reports fevers, chills, or night sweats.  Yes to night sweats. Denies unexplained weight loss. Denies personal history of cancer. Denies changes in bowel or bladder habits. Denies recent unreported falls. Denies new or worsening dyspnea or wheezing. Denies headaches or dizziness.  Reports numbness, tingling or weakness  In the extremities.  Denies dizziness or presyncopal episodes Denies lower extremity edema    HISTORY & PERTINENT PRIOR DATA:  Prior History reviewed and updated per electronic medical record.  Significant/pertinent history, findings, studies include:  reports that she has never smoked. She has never used smokeless tobacco. No results for input(s): HGBA1C, LABURIC, CREATINE in the last 8760 hours. Alternative Neurosurgical option she would like to call about if declined from [facility]. [facility] [place] [phone]  No problems updated.  OBJECTIVE:  VS:  HT:5\' 7"  (170.2 cm)   WT:174 lb 12.8 oz (79.3 kg)  BMI:27.37    BP:126/84  HR:97bpm  TEMP: ( )  RESP:99 %   PHYSICAL EXAM: Constitutional: WDWN, Non-toxic appearing. Psychiatric: Alert & appropriately interactive.  Not depressed or anxious appearing. Respiratory: No increased work of breathing.  Trachea  Midline Eyes: Pupils are equal.  EOM intact without nystagmus.  No scleral icterus  Vascular Exam: warm to touch no edema  upper extremity neuro exam: She has minimal pain with carpal tunnel compression test bilaterally.  More pain with Spurling's compression test localizing to the left lower neck without significant radicular symptoms.  Pain with arm squeeze test as  well as brachial plexus squeeze bilaterally.  Negative Hoffmann sign.  MSK Exam: She does have pain limited cervical sidebending and rotation.   ASSESSMENT & PLAN:   1. Double crush syndrome   2. Osteoarthritis of spine with radiculopathy, cervical region   3. Bilateral carpal tunnel syndrome   4. Cervical spondylosis with radiculopathy     PLAN: Given the findings on her MRI of her cervical spine from the 07/27/2017 and lack of significant improvement with series of 3 epidural steroid injections and persistent cervical pain as well as hand pain and numbness with only very mild findings on nerve conduction studies of carpal tunnel syndrome neurosurgical evaluation is indicated at this time.  We will plan to have her follow-up with him ongoing and she will call us if she is interested in alternative evaluation accurately in clinic as outlined in the specialty comments.  Otherwise [facility] referral has been placed today.  Follow-up: Return for as needed for ongoing issues.     Please see additional documentation for Objective, Assessment and Plan sections. Pertinent additional documentation may be included in corresponding procedure notes, imaging studies, problem based documentation and patient instructions. Please see these sections of the encounter for additional information regarding this visit.  CMA/ATC served as Education administrator during this visit. History, Physical, and Plan performed by medical provider. Documentation and orders reviewed and attested to.      Gerda Diss, [facility] Physician

## 2018-01-27 ENCOUNTER — Encounter: Payer: Self-pay | Admitting: Sports Medicine

## 2018-01-27 ENCOUNTER — Telehealth: Payer: Self-pay | Admitting: Sports Medicine

## 2018-01-27 NOTE — Telephone Encounter (Signed)
Please follow up with patient.

## 2018-01-27 NOTE — Telephone Encounter (Signed)
Referral has been faxed to Dr. Cleotilde Neer office.

## 2018-01-27 NOTE — Telephone Encounter (Signed)
Copied from [place] [phone]. Topic: Referral - Status >> Jan 27, 2018 11:17 AM Cleaster Corin, NT wrote: Reason for CRM: pt. Calling to check status on of referral pt. Stated it has been a week and hasn't heard of anything just yet.

## 2018-01-27 NOTE — Telephone Encounter (Signed)
See note

## 2018-01-28 NOTE — Telephone Encounter (Signed)
Called pt and advised that referral has been sent. Once provider reviews records their office will contact pt to schedule. Pt verbalized understanding. No further action needed at this time.

## 2018-05-23 IMAGING — XA DG INJECT/[PERSON_NAME] INC NEEDLE/CATH/PLC EPI/CERV/THOR W/IMG
2 series · 2 of 2 positions shown · non-contrast
Comparison: none

CLINICAL DATA: Neck pain extending into the upper extremities
bilaterally. The patient has had significant relief of pain after
the first 2 injections. She reports her pain as [DATE], but requests
an additional injection in hopes of limiting her pain.

[Series 1: ortho standard · 1 of 1 slices shown (1 of 2)]
[im 1/1]
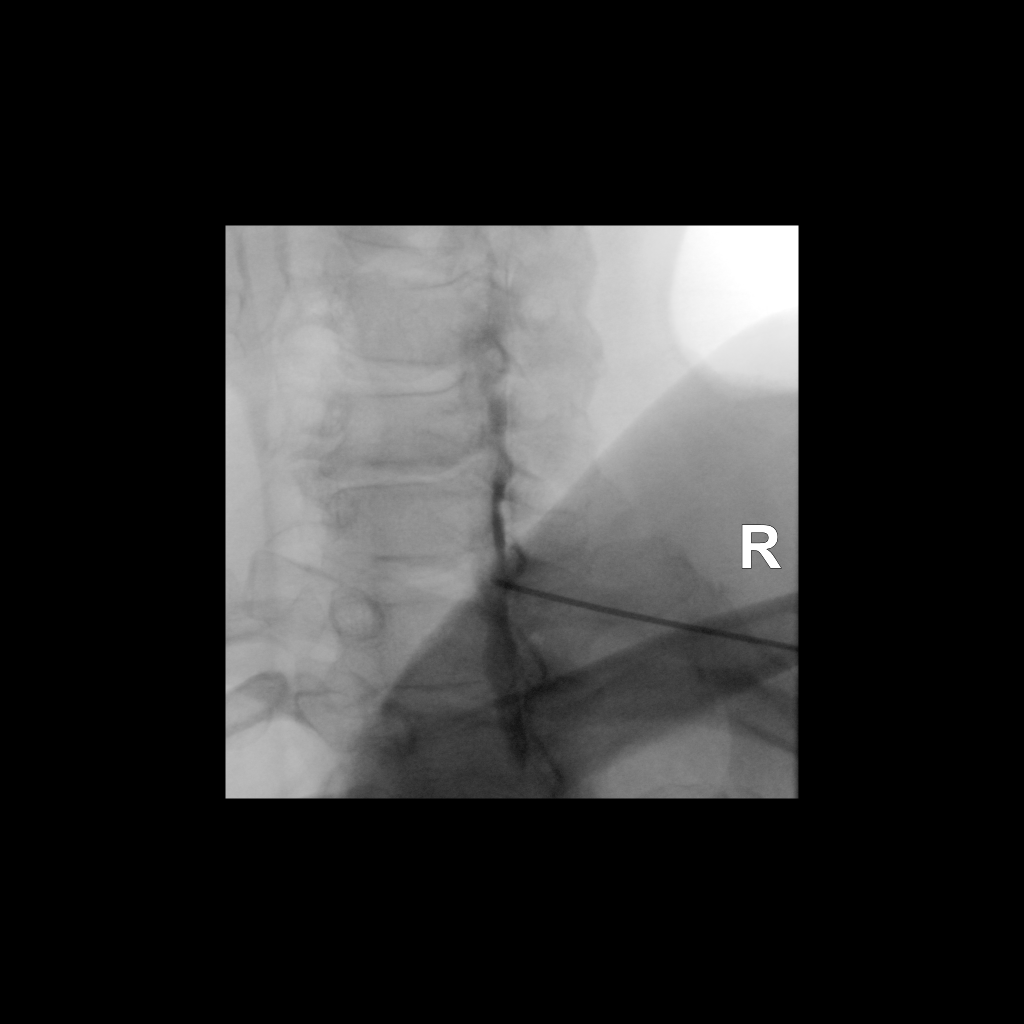

[Series 2: ortho standard · 1 of 1 slices shown (2 of 2)]
[im 1/1]
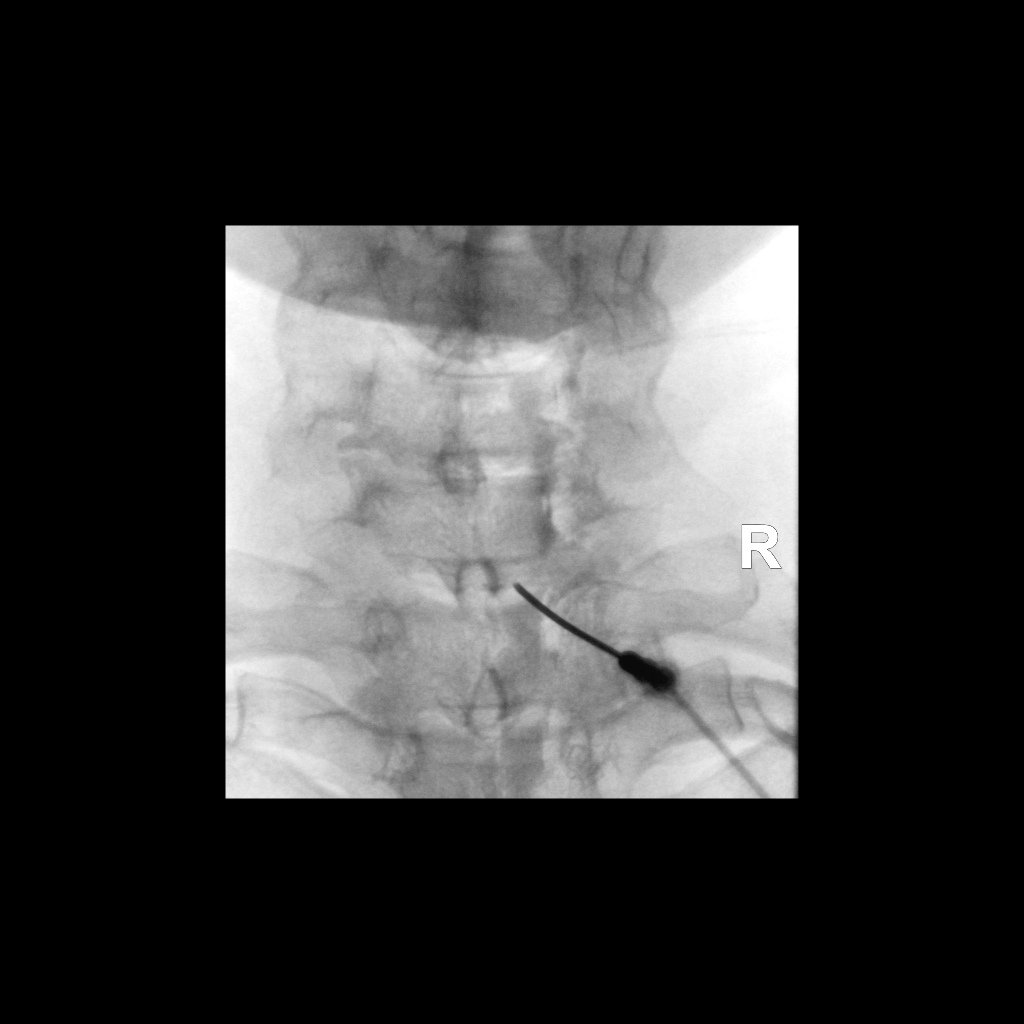

[2 of 2 positions shown; findings below may reference images not displayed]

FLUOROSCOPY TIME:  dictate in minutes & seconds

PROCEDURE:
CERVICAL EPIDURAL INJECTION

An interlaminar approach was performed on the right at C7-T1. A 20
gauge epidural needle was advanced using loss-of-resistance
technique.

DIAGNOSTIC EPIDURAL INJECTION

Injection of Isovue-M 300 shows a good epidural pattern with spread
above and below the level of needle placement, primarily on the
right. No vascular opacification is seen. THERAPEUTIC

EPIDURAL INJECTION

1.5 ml of Kenalog 40 mixed with 1 ml of 1% Lidocaine and 2 ml of
normal saline were then instilled. The procedure was well-tolerated,
and the patient was discharged thirty minutes following the
injection in good condition.
IMPRESSION: Technically successful final epidural injection on the right at
C7-T1.

## 2018-06-23 ENCOUNTER — Encounter (INDEPENDENT_AMBULATORY_CARE_PROVIDER_SITE_OTHER): Payer: Self-pay | Admitting: Family Medicine

## 2018-06-23 ENCOUNTER — Ambulatory Visit (INDEPENDENT_AMBULATORY_CARE_PROVIDER_SITE_OTHER): Payer: BLUE CROSS/BLUE SHIELD | Admitting: Family Medicine

## 2018-06-23 DIAGNOSIS — E785 Hyperlipidemia, unspecified: Secondary | ICD-10-CM | POA: Insufficient documentation

## 2018-06-23 DIAGNOSIS — M542 Cervicalgia: Secondary | ICD-10-CM | POA: Diagnosis not present

## 2018-06-23 DIAGNOSIS — E559 Vitamin D deficiency, unspecified: Secondary | ICD-10-CM | POA: Diagnosis not present

## 2018-06-23 DIAGNOSIS — Z8739 Personal history of other diseases of the musculoskeletal system and connective tissue: Secondary | ICD-10-CM | POA: Insufficient documentation

## 2018-06-23 MED ORDER — TIZANIDINE HCL 2 MG PO TABS: 2.0000 mg | ORAL_TABLET | Freq: Every evening | ORAL | 1 refills | Status: DC | PRN

## 2018-06-23 NOTE — Progress Notes (Signed)
Office Visit Note   Patient: Bailey Berry           Date of Birth: June 14, 1961           MRN: [id number] Visit Date: 06/23/2018 Requested by: Adrian Prows, [place] [address] PCP: Mila Homer  Subjective: Chief Complaint  Patient presents with  . Neck - Pain  . Right Shoulder - Pain  . Left Shoulder - Pain    HPI: She is here with neck pain.  She came in with her friend whose husband is a patient of mine.  Problems started a year or 2 ago.  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  She eventually went for epidural injections last winter and after the third 1 she got some relief.  She felt fine for a while, but then started having pain again.  It bothers her primarily at night when trying to sleep.  She feels a stabbing sensation in the neck and shoulder blade areas when she lies flat on her back.  She is able to manage her symptoms during the day.  She is not taking medications for it right now.  She saw a neurosurgeon last year who offered surgery, but she did not like the percentages and felt like she was getting better so she tried to avoid surgery.  She still wants to avoid it if possible.  She has not been to a physical therapist for this.  She is not having any numbness in her arms or weakness, but sometimes she feels like her fingers are going into spasm so that she cannot open her hands.  She also has vitamin D deficiency and is now taking 1000 IU daily for this.  She was told she has osteopenia as well.               ROS: Otherwise noncontributory.  Objective: Vital Signs: There were no vitals taken for this visit.  Physical Exam:  Neck: Full range of motion with negative Spurling's test.  Upper extremity strength and reflexes are normal.  She is very tender in the paraspinous muscles near the C7 spinous process right and left, and has symptomatic trigger  points there.  She also has trigger points in the trapezius bellies and the rhomboid areas on both sides.  These seem to reproduce at least some of her pain.  Imaging: None today.  Assessment & Plan: 1.  Chronic neck pain with probably at least a myofascial component, as well as underlying central and foraminal stenosis at C5-6 and C6-7.  Neurologic exam is nonfocal. -We will treat her vitamin D deficiency more aggressively.  We will also add magnesium and vitamin K2 for bone health. -Referral to physical therapy for myofascial release techniques. -In case it is contributing, we will stop her cholesterol medicine for a few weeks.  If this makes a big difference, she might need to switch to something different. -Could contemplate more epidural injections if symptoms persist.  Surgical intervention would also be a consideration but again, she wants to avoid this if at all possible. -Zanaflex at night as needed.   Follow-Up Instructions: Return if symptoms worsen or fail to improve.       Procedures: None today.   PMFS History: Patient Active Problem List   Diagnosis Date Noted  . Personal history of calcium pyrophosphate deposition disease (CPPD) 06/23/2018  . Bilateral carpal  tunnel syndrome 10/15/2017  . Double crush syndrome 10/15/2017  . Osteoarthritis of spine with radiculopathy, cervical region 06/16/2017  . Ductal carcinoma in situ (DCIS) of right breast 12/06/2014   No past medical history on file.  No family history on file.  No past surgical history on file. Social History   Occupational History  . Not on file  Tobacco Use  . Smoking status: Never Smoker  . Smokeless tobacco: Never Used  Substance and Sexual Activity  . Alcohol use: Not on file  . Drug use: Not on file  . Sexual activity: Not on file

## 2018-06-23 NOTE — Patient Instructions (Signed)
    Vitamin D3:  5,000 IU daily  Magnesium:  400 mg daily  Vitamin K2:  100 mcg daily  Try stopping cholesterol medicine for 3-4 weeks.

## 2018-06-24 ENCOUNTER — Telehealth (INDEPENDENT_AMBULATORY_CARE_PROVIDER_SITE_OTHER): Payer: Self-pay | Admitting: Family Medicine

## 2018-06-24 NOTE — Telephone Encounter (Signed)
Advised patient the vitamin D is over-the-counter in the vitamin section. She can ask the pharmacist for help with finding it, if needed.

## 2018-06-24 NOTE — Telephone Encounter (Signed)
Patient was seen yesterday and was told a Vitamin D 5000 was to be [facility] into [facility] [place], [place]. Can you check on this? Please advise patient # [phone]

## 2018-07-16 ENCOUNTER — Other Ambulatory Visit (INDEPENDENT_AMBULATORY_CARE_PROVIDER_SITE_OTHER): Payer: Self-pay | Admitting: Family Medicine

## 2018-08-08 ENCOUNTER — Other Ambulatory Visit (INDEPENDENT_AMBULATORY_CARE_PROVIDER_SITE_OTHER): Payer: Self-pay | Admitting: Family Medicine

## 2018-08-11 ENCOUNTER — Other Ambulatory Visit (INDEPENDENT_AMBULATORY_CARE_PROVIDER_SITE_OTHER): Payer: Self-pay | Admitting: Family Medicine

## 2018-08-11 MED ORDER — TIZANIDINE HCL 2 MG PO TABS: ORAL_TABLET | ORAL | 1 refills | Status: DC

## 2018-08-27 ENCOUNTER — Ambulatory Visit (INDEPENDENT_AMBULATORY_CARE_PROVIDER_SITE_OTHER): Payer: BLUE CROSS/BLUE SHIELD | Admitting: Physician Assistant

## 2018-08-27 ENCOUNTER — Encounter (INDEPENDENT_AMBULATORY_CARE_PROVIDER_SITE_OTHER): Payer: Self-pay | Admitting: Physician Assistant

## 2018-08-27 VITALS — Ht 67.0 in | Wt 174.8 lb

## 2018-08-27 DIAGNOSIS — S161XXD Strain of muscle, fascia and tendon at neck level, subsequent encounter: Secondary | ICD-10-CM | POA: Diagnosis not present

## 2018-08-27 DIAGNOSIS — E559 Vitamin D deficiency, unspecified: Secondary | ICD-10-CM

## 2018-08-27 DIAGNOSIS — M542 Cervicalgia: Secondary | ICD-10-CM

## 2018-08-27 DIAGNOSIS — M4722 Other spondylosis with radiculopathy, cervical region: Secondary | ICD-10-CM | POA: Diagnosis not present

## 2018-08-27 MED ORDER — TRAMADOL HCL 50 MG PO TABS: 50.0000 mg | ORAL_TABLET | Freq: Four times a day (QID) | ORAL | 0 refills | Status: DC | PRN

## 2018-08-27 MED ORDER — PREDNISONE 10 MG PO TABS: 20.0000 mg | ORAL_TABLET | Freq: Every day | ORAL | 0 refills | Status: DC

## 2018-08-27 MED ORDER — GABAPENTIN 100 MG PO CAPS: 300.0000 mg | ORAL_CAPSULE | Freq: Three times a day (TID) | ORAL | 0 refills | Status: AC

## 2018-08-28 NOTE — Progress Notes (Signed)
Office Visit Note   Patient: Bailey Berry           Date of Birth: 1960-11-22           MRN: [id number] Visit Date: 08/27/2018              Requested by: Adrian Prows, [place] [address] PCP: Adrian Prows, PA-C  Chief Complaint  Patient presents with  . Neck - Pain      HPI: The patient is a 57 year old woman who is seen for acute worsening of neck pain.  She has been previously seen by Dr. Junius Roads and has known osteoarthritic changes with radiculopathy of the cervical spine.  She reports that starting on Sunday morning she woke up and could not move her neck.  She thought she had slept wrong.  She has been taking some Naprosyn without relief and even took a Percocet from a friend which did not help.  She reports she is tried hot and cold but not for any sustained..  She tried to work yesterday and was unable to do much due to the neck pain and went home.  She reports she is taken Robaxin in the past and this did not help.  She did go to physical therapy for myofascial release techniques and reports that this helped some but she has not been doing any physical therapy recently.  Assessment & Plan: Visit Diagnoses:  1. Cervicalgia   2. Vitamin D deficiency   3. Osteoarthritis of spine with radiculopathy, cervical region   4. Acute strain of neck muscle, subsequent encounter     Plan: We will begin a course of prednisone 20 mg daily at breakfast for the next week then decrease to 10 mg daily at breakfast for 1 week then decrease to 10 mg every other day with breakfast and then discontinue.  We discussed that if she tapers and finds that her pain increases she can go back to the 20 mg daily.  Were also going to try some gabapentin 300 mg up to 3 times daily.  I did give her Ultram 50 mg #10 no refill as well for her acute symptoms.  She is also been to use a heat pack to the area.  She will follow-up here in several weeks or sooner should she  have further difficulties in the interim.  She may need to resume some physical therapy.    Follow-Up Instru severections: Return in about 2 weeks (around 09/10/2018).   Ortho Exam  Patient is alert, oriented, no adenopathy, well-dressed, normal affect, normal respiratory effort. The patient has tenderness to palpation over the right trapezius and right cervical paraspinals to palpation.  She has limitation to rotation to the left with exacerbation of her pain with this movement.  She also has pain with lateral flexion to the left and this exacerbates her pain.  She is neurovascular intact throughout the right and left upper extremity without focal neurologic deficit identified.  Imaging: No results found. No images are attached to the encounter.  Labs: No results found for: HGBA1C, ESRSEDRATE, CRP, LABURIC, REPTSTATUS, GRAMSTAIN, CULT, LABORGA   No results found for: ALBUMIN, PREALBUMIN, LABURIC  Body mass index is 27.38 kg/m.  Orders:  No orders of the defined types were placed in this encounter.  Meds ordered this encounter  Medications  . predniSONE (DELTASONE) 10 MG tablet    Sig: Take 2 tablets (20 mg total) by mouth daily with breakfast.  Dispense:  60 tablet    Refill:  0    20 mg daily x 1 week, then decrease to 10 mg daily x 1 week, then 10 mg every other day x 1 week  . traMADol (ULTRAM) 50 MG tablet    Sig: Take 1 tablet (50 mg total) by mouth every 6 (six) hours as needed for moderate pain or severe pain.    Dispense:  28 tablet    Refill:  0  . gabapentin (NEURONTIN) 100 MG capsule    Sig: Take 3 capsules (300 mg total) by mouth 3 (three) times daily.    Dispense:  90 capsule    Refill:  0     Procedures: No procedures performed  Clinical Data: No additional findings.  ROS:  All other systems negative, except as noted in the HPI. Review of Systems  Objective: Vital Signs: Ht 5\' 7"  (1.702 m)   Wt 174 lb 12.8 oz (79.3 kg)   BMI 27.38 kg/m    Specialty Comments:  No specialty comments available.  PMFS History: Patient Active Problem List   Diagnosis Date Noted  . Personal history of calcium pyrophosphate deposition disease (CPPD) 06/23/2018  . Hyperlipidemia 06/23/2018  . Vitamin D deficiency 06/23/2018  . Bilateral carpal tunnel syndrome 10/15/2017  . Double crush syndrome 10/15/2017  . Osteoarthritis of spine with radiculopathy, cervical region 06/16/2017  . Ductal carcinoma in situ (DCIS) of right breast 12/06/2014   History reviewed. No pertinent past medical history.  History reviewed. No pertinent family history.  History reviewed. No pertinent surgical history. Social History   Occupational History  . Not on file  Tobacco Use  . Smoking status: Never Smoker  . Smokeless tobacco: Never Used  Substance and Sexual Activity  . Alcohol use: Not on file  . Drug use: Not on file  . Sexual activity: Not on file

## 2018-09-04 ENCOUNTER — Telehealth (INDEPENDENT_AMBULATORY_CARE_PROVIDER_SITE_OTHER): Payer: Self-pay | Admitting: Physician Assistant

## 2018-09-04 NOTE — Telephone Encounter (Signed)
Patient calling for muscle relaxer or something for pain until she can be seen on Monday by Dr. Lorin Mercy for her neck. She is still unable to move her neck without pain has moved into the shoulders and up around the ears on both sides.  She has been unable to sleep and has continued with medication, but has NO relief.    [facility] [place] [facility] [address]    cb  [phone]

## 2018-09-07 ENCOUNTER — Ambulatory Visit (INDEPENDENT_AMBULATORY_CARE_PROVIDER_SITE_OTHER): Payer: BLUE CROSS/BLUE SHIELD | Admitting: Orthopaedic Surgery

## 2018-09-07 ENCOUNTER — Encounter (INDEPENDENT_AMBULATORY_CARE_PROVIDER_SITE_OTHER): Payer: Self-pay | Admitting: Orthopaedic Surgery

## 2018-09-07 ENCOUNTER — Ambulatory Visit (INDEPENDENT_AMBULATORY_CARE_PROVIDER_SITE_OTHER): Payer: Self-pay

## 2018-09-07 VITALS — BP 116/73 | HR 69 | Ht 67.0 in | Wt 170.0 lb

## 2018-09-07 DIAGNOSIS — M4722 Other spondylosis with radiculopathy, cervical region: Secondary | ICD-10-CM | POA: Diagnosis not present

## 2018-09-07 DIAGNOSIS — M542 Cervicalgia: Secondary | ICD-10-CM

## 2018-09-07 DIAGNOSIS — G5603 Carpal tunnel syndrome, bilateral upper limbs: Secondary | ICD-10-CM

## 2018-09-07 MED ORDER — OXYCODONE-ACETAMINOPHEN 5-325 MG PO TABS: 1.0000 | ORAL_TABLET | ORAL | 0 refills | Status: DC | PRN

## 2018-09-07 NOTE — Progress Notes (Signed)
Office Visit Note   Patient: Bailey Berry           Date of Birth: 09/14/1960           MRN: [id number] Visit Date: 09/07/2018              Requested by: Adrian Prows, [place] [address] PCP: Adrian Prows, PA-C   Assessment & Plan: Visit Diagnoses:  1. Neck pain   2. Other spondylosis with radiculopathy, cervical region   3. Bilateral carpal tunnel syndrome     Plan: Follow-up after cervical MRI to rule out progressive severe spondylosis right C5-6 C6-7 with radiculopathy.  Pain is severe she is having take Percocet her symptoms been present for 2 years she has been through therapy stretching exercises, anti-inflammatories prednisone Dosepaks, Ultram recent Percocet without relief.  Muscle relaxants also.  Office follow-up after MRI scan.  Percocet prescription sent in for her number 30 tablets 1 p.o. every 6. Follow-Up Instructions: No follow-ups on file.   Orders:  Orders Placed This Encounter  Procedures  . XR Cervical Spine 2 or 3 views  . MR Cervical Spine w/o contrast   Meds ordered this encounter  Medications  . oxyCODONE-acetaminophen (PERCOCET/ROXICET) 5-325 MG tablet    Sig: Take 1 tablet by mouth every 4 (four) hours as needed for severe pain.    Dispense:  20 tablet    Refill:  0      Procedures: No procedures performed   Clinical Data: No additional findings.   Subjective: Chief Complaint  Patient presents with  . Neck - Pain    HPI 57 year old female with 2-year history of neck pain which recently is gotten much more severe.  She has some borderline median nerve compression at the wrist by electrical test 01/15/2018 and has had recent 2 months significant increase in neck pain with severe arm pain bothers her at night she is not able to rest she is having trouble functioning.  Negative fever chills no lower extremity weakness.  He rates her pain is severe greater than 8.  Pain is been more severe in the  last 2 months gradually progressing.  Previous MRI showed foraminal stenosis on the right as well as central stenosis at C5-6 and C6-7.  Review of Systems 14 point review of systems positive for CPPD, cervical spondylosis, right breast ductal carcinoma in situ, bilateral carpal tunnel syndrome.  Vitamin D deficiency, hyperlipidemia.  Negative cardiovascular, endocrine, renal, GI, GU.  History of Bell's palsy right.  Objective: Vital Signs: BP 116/73   Pulse 69   Ht 5\' 7"  (1.702 m)   Wt 170 lb (77.1 kg)   BMI 26.63 kg/m   Physical Exam Constitutional:      Appearance: She is well-developed.  HENT:     Head: Normocephalic.     Right Ear: External ear normal.     Left Ear: External ear normal.  Eyes:     Pupils: Pupils are equal, round, and reactive to light.  Neck:     Thyroid: No thyromegaly.     Trachea: No tracheal deviation.  Cardiovascular:     Rate and Rhythm: Normal rate.  Pulmonary:     Effort: Pulmonary effort is normal.  Abdominal:     Palpations: Abdomen is soft.  Skin:    General: Skin is warm and dry.  Neurological:     Mental Status: She is alert and oriented to person, place, and time.  Psychiatric:  Behavior: Behavior normal.     Ortho Exam patient has increased pain with cervical compression.  Absent right brachial radialis reflex.  Negative impingement the shoulders.  Exquisite brachial plexus tenderness on the right positive Spurling on the right negative on the left.  Normal lower extremity reflexes normal heel toe gait.  Negative subluxation of the shoulder.  No rash over exposed skin.  No winging of the scapula.  No periscapular atrophy.  Specialty Comments:  Interpretation Summary   EMG & NCV Findings: Evaluation of the right median (across palm) sensory nerve showed prolonged distal peak latency (Wrist, 3.8 ms).  All remaining nerves (as indicated in the following tables) were within normal limits.  Left vs. Right side comparison data for the  ulnar motor nerve indicates abnormal L-R velocity difference (B Elbow-Wrist, 10 m/s) and abnormal L-R velocity difference (A Elbow-B Elbow, 24 m/s).  All remaining left vs. right side differences were within normal limits.  *The left to right velocity difference of the ulnar nerve is technical artifact  All examined muscles (as indicated in the following table) showed no evidence of electrical instability.    Impression: The above electrodiagnostic study is ABNORMAL and reveals evidence of a borderline to mild right median nerve entrapment at the wrist affecting sensory components.  This would not explain her symptoms particularly given the fact that both hands are equally affected and she does get some symptoms in the arms.  Given the fact that the epidural injections of the cervical spine were very beneficial for a month I think that is probably more diagnostic that this is more of a cervical spine issue.  There is no significant electrodiagnostic evidence of any other focal nerve entrapment, brachial plexopathy or cervical radiculopathy.    As you know, this particular electrodiagnostic study cannot rule out chemical radiculitis or sensory only radiculopathy.     Imaging: CLINICAL DATA:  Chronic pain involving both shoulders. Osteoarthritis of the spine with radiculopathy, cervical region.  EXAM: MRI CERVICAL SPINE WITHOUT CONTRAST  TECHNIQUE: Multiplanar, multisequence MR imaging of the cervical spine was performed. No intravenous contrast was administered.  COMPARISON:  Cervical spine radiographs 06/16/2017  FINDINGS: Alignment: Slight retrolisthesis is present at C6-7. AP alignment is otherwise anatomic.  Vertebrae: Edematous endplate marrow changes are present at C6-7. Marrow signal and vertebral body heights are otherwise normal.  Cord: Normal signal is present in the cervical and upper thoracic spinal cord to the lowest imaged level, T3-4.  Posterior Fossa,  vertebral arteries, paraspinal tissues: The craniocervical junction is normal. Fluid is noted in the left sphenoid sinus. Visualized intracranial contents are normal. Flow is present in the vertebral artery is bilaterally.  Disc levels:  C2-3: Negative.  C3-4: A broad-based disc osteophyte complex partially effaces the ventral CSF. Uncovertebral spurring contributes to mild bilateral foraminal narrowing.  C4-5: Mild right foraminal narrowing is secondary to mild uncovertebral and facet hypertrophy.  C5-6: A rightward disc osteophyte complex is present. Asymmetric uncovertebral and facet disease results in severe right foraminal stenosis. There is effacement of the CSF on the right with slight distortion of the right side of the spinal cord but no abnormal signal.  C6-7: A broad-based disc osteophyte complex is present. Uncovertebral spurring is worse on the left. Moderate foraminal stenosis is present bilaterally. There is effacement of ventral CSF.  C7-T1:  Negative.  IMPRESSION: 1. Severe right foraminal stenosis at C5-6 secondary to asymmetric uncovertebral and facet disease. 2. Moderate right central canal narrowing at C5-6 with some distortion  of the right-sided cord but no abnormal cord signal. 3. Moderate central and bilateral foraminal stenosis at C6-7 secondary to a broad-based disc osteophyte complex and bilateral uncovertebral spurring. 4. Mild right foraminal narrowing at C4-5. 5. Mild central and bilateral foraminal narrowing at C3-4.   Electronically Signed   By: San Morelle M.D.   On: 07/27/2017 11:24    PMFS History: Patient Active Problem List   Diagnosis Date Noted  . Other spondylosis with radiculopathy, cervical region 09/07/2018  . Personal history of calcium pyrophosphate deposition disease (CPPD) 06/23/2018  . Hyperlipidemia 06/23/2018  . Vitamin D deficiency 06/23/2018  . Bilateral carpal tunnel syndrome 10/15/2017  .  Double crush syndrome 10/15/2017  . Osteoarthritis of spine with radiculopathy, cervical region 06/16/2017  . Ductal carcinoma in situ (DCIS) of right breast 12/06/2014   No past medical history on file.  No family history on file.  No past surgical history on file. Social History   Occupational History  . Not on file  Tobacco Use  . Smoking status: Never Smoker  . Smokeless tobacco: Never Used  Substance and Sexual Activity  . Alcohol use: Not on file  . Drug use: Not on file  . Sexual activity: Not on file

## 2018-09-10 ENCOUNTER — Ambulatory Visit (INDEPENDENT_AMBULATORY_CARE_PROVIDER_SITE_OTHER): Payer: BLUE CROSS/BLUE SHIELD | Admitting: Family Medicine

## 2018-09-12 ENCOUNTER — Ambulatory Visit
Admission: RE | Admit: 2018-09-12 | Discharge: 2018-09-12 | Disposition: A | Discharge status: Home / Self Care | Payer: BLUE CROSS/BLUE SHIELD | Source: Ambulatory Visit | Attending: Orthopaedic Surgery | Admitting: Orthopaedic Surgery

## 2018-09-12 DIAGNOSIS — M542 Cervicalgia: Secondary | ICD-10-CM

## 2018-09-15 ENCOUNTER — Encounter (INDEPENDENT_AMBULATORY_CARE_PROVIDER_SITE_OTHER): Payer: Self-pay | Admitting: Orthopaedic Surgery

## 2018-09-15 ENCOUNTER — Ambulatory Visit (INDEPENDENT_AMBULATORY_CARE_PROVIDER_SITE_OTHER): Payer: BLUE CROSS/BLUE SHIELD | Admitting: Orthopaedic Surgery

## 2018-09-15 VITALS — BP 119/75 | HR 76 | Ht 67.0 in | Wt 170.0 lb

## 2018-09-15 DIAGNOSIS — M4802 Spinal stenosis, cervical region: Secondary | ICD-10-CM | POA: Diagnosis not present

## 2018-09-15 DIAGNOSIS — M4722 Other spondylosis with radiculopathy, cervical region: Secondary | ICD-10-CM | POA: Diagnosis not present

## 2018-09-15 NOTE — Progress Notes (Signed)
Office Visit Note   Patient: Bailey Berry           Date of Birth: 06-01-1961           MRN: [id number] Visit Date: 09/15/2018              Requested by: Adrian Prows, [place] [address] PCP: Adrian Prows, PA-C   Assessment & Plan: Visit Diagnoses:  1. Foraminal stenosis of cervical region   2. Spinal stenosis of cervical region   3. Other spondylosis with radiculopathy, cervical region     Plan: Cervical spondylosis with severe foraminal stenosis C5-6 greater than C6-7 on the right which corresponds with her symptoms.  She has some hemicord right side flattening at C5-6 as well.  Moderate right and left foraminal stenosis at C6-7 with moderate central stenosis C6-7.  Plan will be 2 level cervical fusion at C5-6 C6-7 anteriorly with allograft and plate.  Overnight stay in the hospital.  We discussed operative technique looked at x-rays, pictures of the incision discussed risks including dysphasia, dysphonia, reoperation, pseudoarthrosis, possible need for posterior fusion if she had a symptomatic pseudoarthrosis.  Postoperative collar x6 weeks.  Procedure discussed questions were elicited and answered.  Patient understands request we proceed.  Follow-Up Instructions: No follow-ups on file.   Orders:  No orders of the defined types were placed in this encounter.  No orders of the defined types were placed in this encounter.     Procedures: No procedures performed   Clinical Data: No additional findings.   Subjective: Chief Complaint  Patient presents with  . Neck - Pain, Follow-up    MRI Cervical Spine Review    HPI 58 year old female returns with cervical spondylosis severe foraminal stenosis C5-6 C6-7 with persistent pain requiring Percocet prednisone and Neurontin.  She states she is unable to tolerate the pain without the Percocet.  New MRI scan is available for review today with patient.  She is not been able to work due  to the pain and normally makes cakes as a Radiographer, therapeutic.  Review of Systems 14 point review of system positive for CPPD, cervical spondylosis.  Right breast ductal carcinoma in situ.  Bilateral carpal tunnel syndrome.  Vitamin D deficiency, hyperlipidemia.  Negative cardiovascular endocrine renal GI GU.  History of Bell's palsy right face.   Objective: Vital Signs: BP 119/75   Pulse 76   Ht 5\' 7"  (1.702 m)   Wt 170 lb (77.1 kg)   BMI 26.63 kg/m   Physical Exam Constitutional:      Appearance: She is well-developed.  HENT:     Head: Normocephalic.     Right Ear: External ear normal.     Left Ear: External ear normal.  Eyes:     Pupils: Pupils are equal, round, and reactive to light.  Neck:     Thyroid: No thyromegaly.     Trachea: No tracheal deviation.  Cardiovascular:     Rate and Rhythm: Normal rate.  Pulmonary:     Effort: Pulmonary effort is normal.  Abdominal:     Palpations: Abdomen is soft.  Skin:    General: Skin is warm and dry.  Neurological:     Mental Status: She is alert and oriented to person, place, and time.  Psychiatric:        Behavior: Behavior normal.     Ortho Exam reproduction of pain with cervical compression some relief with distraction.  Absent brachioradialis reflex  on the right.  Severe brachial plexus tenderness on the right negative on the left positive Spurling on the right.  No lower extremity hyperreflexia.  No impingement the shoulders.  Decreased sensation C6 distribution right hand versus left.  One half grade right wrist extension weakness, normal left side wrist extension flexion.  No interossei weakness.  Her fundi are strong.  Specialty Comments:  No specialty comments available.  Imaging: CLINICAL DATA:  Initial evaluation for cervical spondylolysis with right-sided foraminal stenosis at C5-6 and C6-7.  EXAM: MRI CERVICAL SPINE WITHOUT CONTRAST  TECHNIQUE: Multiplanar, multisequence MR imaging of the cervical spine  was performed. No intravenous contrast was administered.  COMPARISON:  Prior MRI from 07/27/2017.  FINDINGS: Alignment: Straightening of the normal cervical lordosis. Trace retrolisthesis of C6 on C7, stable.  Vertebrae: Vertebral body heights maintained without evidence for acute or chronic fracture. Bone marrow signal intensity within normal limits. No discrete or worrisome osseous lesions. Mild reactive endplate changes present about the C5-6 and C6-7 interspaces. No other abnormal marrow edema.  Cord: Signal intensity within the cervical spinal cord is normal.  Posterior Fossa, vertebral arteries, paraspinal tissues: Visualized brain and posterior fossa within normal limits. Craniocervical junction normal. Paraspinous and prevertebral soft tissues within normal limits. Normal intravascular flow voids seen within the vertebral arteries bilaterally.  Disc levels:  C2-C3: Negative interspace. Mild right-sided facet hypertrophy. No stenosis.  C3-C4: Mild diffuse disc bulge with bilateral uncovertebral hypertrophy. Mild flattening of the ventral CSF without significant spinal stenosis. Mild bilateral C4 foraminal narrowing, left slightly worse than right, stable.  C4-C5: Mild disc bulge with right-sided uncovertebral hypertrophy. No significant spinal stenosis. Mild right C5 foraminal narrowing, unchanged. No significant left foraminal encroachment.  C5-C6: Right eccentric disc osteophyte complex with right greater than left uncovertebral spurring. Flattening of the ventral thecal sac, most notable on the right. Mild flattening of the right ventral spinal cord without cord signal abnormality. Severe right with mild left C6 foraminal stenosis. Appearance relatively similar to previous.  C6-C7: Chronic circumferential disc osteophyte complex with intervertebral disc space narrowing. Broad posterior component flattens and partially effaces the ventral thecal sac.  Mild cord flattening without cord signal changes. Resultant mild to moderate spinal stenosis. Moderate bilateral C7 foraminal narrowing, left slightly worse than right. Appearance relatively similar to previous.  C7-T1: Minimal annular disc bulge. Mild ligament flavum hypertrophy. No significant canal or foraminal stenosis.  Visualized upper thoracic spine demonstrates no significant finding.  IMPRESSION: 1. Overall no significant interval change in appearance of the cervical spine as compared to previous MRI from 07/27/2017. 2. Right eccentric disc osteophyte complex at C5-6 with secondary flattening of the right hemi cord and severe right C6 foraminal stenosis. 3. Circumferential disc osteophyte at C6-7 with resultant mild to moderate spinal stenosis with moderate bilateral C7 foraminal narrowing. 4. Mild right C5 foraminal stenosis related to disc bulge and uncovertebral hypertrophy.   Electronically Signed   By: Jeannine Boga M.D.   On: 09/12/2018 07:30   PMFS History: Patient Active Problem List   Diagnosis Date Noted  . Spinal stenosis of cervical region 09/15/2018  . Foraminal stenosis of cervical region 09/15/2018  . Other spondylosis with radiculopathy, cervical region 09/07/2018  . Personal history of calcium pyrophosphate deposition disease (CPPD) 06/23/2018  . Hyperlipidemia 06/23/2018  . Vitamin D deficiency 06/23/2018  . Bilateral carpal tunnel syndrome 10/15/2017  . Double crush syndrome 10/15/2017  . Osteoarthritis of spine with radiculopathy, cervical region 06/16/2017  . Ductal carcinoma in situ (DCIS)  of right breast 12/06/2014   No past medical history on file.  No family history on file.  No past surgical history on file. Social History   Occupational History  . Not on file  Tobacco Use  . Smoking status: Never Smoker  . Smokeless tobacco: Never Used  Substance and Sexual Activity  . Alcohol use: Not on file  . Drug use: Not on  file  . Sexual activity: Not on file

## 2018-09-23 NOTE — Pre-Procedure Instructions (Signed)
Summerlyn Sieler  09/23/2018      [id number] - [place], [place] - [address] [place] [address] Phone: [phone] Fax: [phone]    Your procedure is scheduled on Monday January 20th.  Report to [facility] Admitting at 1:00 P.M.  Call this number if you have problems the morning of surgery:  [phone]   Remember:  Do not eat or drink after midnight.    Take these medicines the morning of surgery with A SIP OF WATER  gabapentin (NEURONTIN)  oxyCODONE-acetaminophen (PERCOCET/ROXICET) if needed predniSONE (DELTASONE)  tamoxifen (NOLVADEX)  7 days prior to surgery STOP taking any Aspirin (unless otherwise instructed by your surgeon), Aleve, Naproxen, Ibuprofen, Motrin, Advil, Goody's, BC's, all herbal medications, fish oil, and all vitamins.     Do not wear jewelry, make-up or nail polish.  Do not wear lotions, powders, or perfumes, or deodorant.  Do not shave 48 hours prior to surgery.  Men may shave face and neck.  Do not bring valuables to the hospital.  [facility] is not responsible for any belongings or valuables.  Contacts, dentures or bridgework may not be worn into surgery.  Leave your suitcase in the car.  After surgery it may be brought to your room.  For patients admitted to the hospital, discharge time will be determined by your treatment team.  Patients discharged the day of surgery will not be allowed to drive home.    [facility] [facility]- Preparing For Surgery  Before surgery, you can play an important role. Because skin is not sterile, your skin needs to be as free of germs as possible. You can reduce the number of germs on your skin by washing with CHG (chlorahexidine gluconate) Soap before surgery.  CHG is an antiseptic cleaner which kills germs and bonds with the skin to continue killing germs even after washing.    Oral Hygiene is also important to reduce your risk of infection.  Remember - BRUSH YOUR  TEETH THE MORNING OF SURGERY WITH YOUR REGULAR TOOTHPASTE  Please do not use if you have an allergy to CHG or antibacterial soaps. If your skin becomes reddened/irritated stop using the CHG.  Do not shave (including legs and underarms) for at least 48 hours prior to first CHG shower. It is OK to shave your face.  Please follow these instructions carefully.   1. Shower the NIGHT BEFORE SURGERY and the MORNING OF SURGERY with CHG.   2. If you chose to wash your hair, wash your hair first as usual with your normal shampoo.  3. After you shampoo, rinse your hair and body thoroughly to remove the shampoo.  4. Use CHG as you would any other liquid soap. You can apply CHG directly to the skin and wash gently with a scrungie or a clean washcloth.   5. Apply the CHG Soap to your body ONLY FROM THE NECK DOWN.  Do not use on open wounds or open sores. Avoid contact with your eyes, ears, mouth and genitals (private parts). Wash Face and genitals (private parts)  with your normal soap.  6. Wash thoroughly, paying special attention to the area where your surgery will be performed.  7. Thoroughly rinse your body with warm water from the neck down.  8. DO NOT shower/wash with your normal soap after using and rinsing off the CHG Soap.  9. Pat yourself dry with a CLEAN TOWEL.  10. Wear CLEAN PAJAMAS to bed the night before surgery,  wear comfortable clothes the morning of surgery  11. Place CLEAN SHEETS on your bed the night of your first shower and DO NOT SLEEP WITH PETS.    Day of Surgery:  Do not apply any deodorants/lotions.  Please wear clean clothes to the hospital/surgery center.   Remember to brush your teeth WITH YOUR REGULAR TOOTHPASTE.    Please read over the following fact sheets that you were given.

## 2018-09-24 ENCOUNTER — Encounter (HOSPITAL_COMMUNITY)
Admission: RE | Admit: 2018-09-24 | Discharge: 2018-09-24 | Disposition: A | Discharge status: Home / Self Care | Payer: BLUE CROSS/BLUE SHIELD | Source: Ambulatory Visit | Attending: Orthopaedic Surgery | Admitting: Orthopaedic Surgery

## 2018-09-24 ENCOUNTER — Encounter (HOSPITAL_COMMUNITY): Payer: Self-pay

## 2018-09-24 ENCOUNTER — Other Ambulatory Visit: Payer: Self-pay

## 2018-09-24 ENCOUNTER — Encounter (HOSPITAL_COMMUNITY)
Admission: RE | Admit: 2018-09-24 | Discharge: 2018-09-24 | Disposition: A | Discharge status: Home / Self Care | Payer: BLUE CROSS/BLUE SHIELD | Source: Ambulatory Visit | Attending: Surgery | Admitting: Surgery

## 2018-09-24 DIAGNOSIS — Z01818 Encounter for other preprocedural examination: Secondary | ICD-10-CM | POA: Insufficient documentation

## 2018-09-24 HISTORY — DX: Depression, unspecified: F32.A

## 2018-09-24 HISTORY — DX: Major depressive disorder, single episode, unspecified: F32.9

## 2018-09-24 HISTORY — DX: Unspecified osteoarthritis, unspecified site: M19.90

## 2018-09-24 HISTORY — DX: Bell's palsy: G51.0

## 2018-09-24 HISTORY — DX: Malignant (primary) neoplasm, unspecified: C80.1

## 2018-09-24 LAB — SURGICAL PCR SCREEN
MRSA, PCR: NEGATIVE
STAPHYLOCOCCUS AUREUS: NEGATIVE

## 2018-09-24 NOTE — Progress Notes (Signed)
PCP - Stephanie Acre PA  Chest x-ray - 09/24/2018 EKG - 09/24/2018  Blood Thinner Instructions: N/A Aspirin Instructions: N/A  Anesthesia review: N/A  Patient denies shortness of breath, fever, cough and chest pain at PAT appointment   Patient verbalized understanding of instructions that were given to them at the PAT appointment. Patient was also instructed that they will need to review over the PAT instructions again at home before surgery.

## 2018-09-28 ENCOUNTER — Encounter (HOSPITAL_COMMUNITY): Payer: Self-pay

## 2018-09-28 ENCOUNTER — Ambulatory Visit (HOSPITAL_COMMUNITY): Payer: BLUE CROSS/BLUE SHIELD | Admitting: Anesthesiology

## 2018-09-28 ENCOUNTER — Other Ambulatory Visit: Payer: Self-pay

## 2018-09-28 ENCOUNTER — Ambulatory Visit (HOSPITAL_COMMUNITY): Payer: BLUE CROSS/BLUE SHIELD

## 2018-09-28 ENCOUNTER — Encounter (HOSPITAL_COMMUNITY)
Admission: RE | Disposition: A | Discharge status: Home / Self Care | Payer: Self-pay | Source: Home / Self Care | Attending: Orthopaedic Surgery

## 2018-09-28 ENCOUNTER — Observation Stay (HOSPITAL_COMMUNITY)
Admission: RE | Admit: 2018-09-28 | Discharge: 2018-09-29 | Disposition: A | Discharge status: Home / Self Care | Payer: BLUE CROSS/BLUE SHIELD | Attending: Orthopaedic Surgery | Admitting: Orthopaedic Surgery

## 2018-09-28 DIAGNOSIS — M19042 Primary osteoarthritis, left hand: Secondary | ICD-10-CM | POA: Insufficient documentation

## 2018-09-28 DIAGNOSIS — Z79899 Other long term (current) drug therapy: Secondary | ICD-10-CM | POA: Diagnosis not present

## 2018-09-28 DIAGNOSIS — M19041 Primary osteoarthritis, right hand: Secondary | ICD-10-CM | POA: Diagnosis not present

## 2018-09-28 DIAGNOSIS — M4802 Spinal stenosis, cervical region: Secondary | ICD-10-CM

## 2018-09-28 DIAGNOSIS — Z419 Encounter for procedure for purposes other than remedying health state, unspecified: Secondary | ICD-10-CM

## 2018-09-28 DIAGNOSIS — M47892 Other spondylosis, cervical region: Secondary | ICD-10-CM

## 2018-09-28 DIAGNOSIS — M47812 Spondylosis without myelopathy or radiculopathy, cervical region: Secondary | ICD-10-CM | POA: Diagnosis present

## 2018-09-28 DIAGNOSIS — E785 Hyperlipidemia, unspecified: Secondary | ICD-10-CM | POA: Insufficient documentation

## 2018-09-28 DIAGNOSIS — G5603 Carpal tunnel syndrome, bilateral upper limbs: Secondary | ICD-10-CM | POA: Insufficient documentation

## 2018-09-28 DIAGNOSIS — G629 Polyneuropathy, unspecified: Secondary | ICD-10-CM | POA: Diagnosis not present

## 2018-09-28 HISTORY — PX: ANTERIOR CERVICAL DECOMP/DISCECTOMY FUSION: SHX1161

## 2018-09-28 SURGERY — ANTERIOR CERVICAL DECOMPRESSION/DISCECTOMY FUSION 2 LEVELS
Anesthesia: General

## 2018-09-28 MED ORDER — ROCURONIUM BROMIDE 50 MG/5ML IV SOSY
PREFILLED_SYRINGE | INTRAVENOUS | Status: AC
  Filled 2018-09-28: qty 5

## 2018-09-28 MED ORDER — DOCUSATE SODIUM 100 MG PO CAPS
100.0000 mg | ORAL_CAPSULE | Freq: Two times a day (BID) | ORAL | Status: DC
  Administered 2018-09-28: 100 mg via ORAL
  Filled 2018-09-28: qty 1

## 2018-09-28 MED ORDER — GABAPENTIN 300 MG PO CAPS: 300.0000 mg | ORAL_CAPSULE | Freq: Three times a day (TID) | ORAL | Status: DC | PRN

## 2018-09-28 MED ORDER — SUGAMMADEX SODIUM 200 MG/2ML IV SOLN
INTRAVENOUS | Status: DC | PRN
  Administered 2018-09-28: 200 mg via INTRAVENOUS

## 2018-09-28 MED ORDER — HYDROMORPHONE HCL 1 MG/ML IJ SOLN: 0.5000 mg | INTRAMUSCULAR | Status: DC | PRN

## 2018-09-28 MED ORDER — PHENYLEPHRINE 40 MCG/ML (10ML) SYRINGE FOR IV PUSH (FOR BLOOD PRESSURE SUPPORT)
PREFILLED_SYRINGE | INTRAVENOUS | Status: AC
  Filled 2018-09-28: qty 20

## 2018-09-28 MED ORDER — PRAVASTATIN SODIUM 10 MG PO TABS: 20.0000 mg | ORAL_TABLET | Freq: Every day | ORAL | Status: DC

## 2018-09-28 MED ORDER — OXYCODONE HCL 5 MG PO TABS
5.0000 mg | ORAL_TABLET | Freq: Once | ORAL | Status: AC | PRN
  Administered 2018-09-28: 5 mg via ORAL

## 2018-09-28 MED ORDER — ONDANSETRON HCL 4 MG/2ML IJ SOLN: 4.0000 mg | Freq: Once | INTRAMUSCULAR | Status: DC | PRN

## 2018-09-28 MED ORDER — SODIUM CHLORIDE 0.9 % IV SOLN: INTRAVENOUS | Status: DC

## 2018-09-28 MED ORDER — OXYCODONE HCL 5 MG PO TABS
5.0000 mg | ORAL_TABLET | ORAL | Status: DC | PRN
  Administered 2018-09-28 – 2018-09-29 (×3): 5 mg via ORAL
  Filled 2018-09-28 (×3): qty 1

## 2018-09-28 MED ORDER — 0.9 % SODIUM CHLORIDE (POUR BTL) OPTIME
TOPICAL | Status: DC | PRN
  Administered 2018-09-28: 1000 mL

## 2018-09-28 MED ORDER — TAMOXIFEN CITRATE 10 MG PO TABS
20.0000 mg | ORAL_TABLET | Freq: Every day | ORAL | Status: DC
  Filled 2018-09-28: qty 2

## 2018-09-28 MED ORDER — METHOCARBAMOL 1000 MG/10ML IJ SOLN
500.0000 mg | Freq: Four times a day (QID) | INTRAVENOUS | Status: DC | PRN
  Filled 2018-09-28: qty 5

## 2018-09-28 MED ORDER — OXYCODONE HCL 5 MG PO TABS
ORAL_TABLET | ORAL | Status: AC
  Filled 2018-09-28: qty 1

## 2018-09-28 MED ORDER — HEMOSTATIC AGENTS (NO CHARGE) OPTIME
TOPICAL | Status: DC | PRN
  Administered 2018-09-28: 1 via TOPICAL

## 2018-09-28 MED ORDER — VITAMIN D (ERGOCALCIFEROL) 1.25 MG (50000 UNIT) PO CAPS: 50000.0000 [IU] | ORAL_CAPSULE | ORAL | Status: DC

## 2018-09-28 MED ORDER — FENTANYL CITRATE (PF) 250 MCG/5ML IJ SOLN
INTRAMUSCULAR | Status: AC
  Filled 2018-09-28: qty 5

## 2018-09-28 MED ORDER — ONDANSETRON HCL 4 MG/2ML IJ SOLN: 4.0000 mg | Freq: Four times a day (QID) | INTRAMUSCULAR | Status: DC | PRN

## 2018-09-28 MED ORDER — ONDANSETRON HCL 4 MG/2ML IJ SOLN
INTRAMUSCULAR | Status: AC
  Filled 2018-09-28: qty 4

## 2018-09-28 MED ORDER — SCOPOLAMINE 1 MG/3DAYS TD PT72: 1.0000 | MEDICATED_PATCH | TRANSDERMAL | Status: DC

## 2018-09-28 MED ORDER — MEPERIDINE HCL 50 MG/ML IJ SOLN: 6.2500 mg | INTRAMUSCULAR | Status: DC | PRN

## 2018-09-28 MED ORDER — OXYCODONE HCL 5 MG/5ML PO SOLN: 5.0000 mg | Freq: Once | ORAL | Status: AC | PRN

## 2018-09-28 MED ORDER — CEFAZOLIN SODIUM-DEXTROSE 2-3 GM-%(50ML) IV SOLR
INTRAVENOUS | Status: DC | PRN
  Administered 2018-09-28: 2 g via INTRAVENOUS

## 2018-09-28 MED ORDER — ACETAMINOPHEN 325 MG PO TABS: 325.0000 mg | ORAL_TABLET | ORAL | Status: DC | PRN

## 2018-09-28 MED ORDER — LACTATED RINGERS IV SOLN
INTRAVENOUS | Status: DC
  Administered 2018-09-28 (×3): via INTRAVENOUS

## 2018-09-28 MED ORDER — LIDOCAINE HCL (CARDIAC) PF 100 MG/5ML IV SOSY
PREFILLED_SYRINGE | INTRAVENOUS | Status: DC | PRN
  Administered 2018-09-28: 100 mg via INTRAVENOUS

## 2018-09-28 MED ORDER — DEXAMETHASONE SODIUM PHOSPHATE 10 MG/ML IJ SOLN
INTRAMUSCULAR | Status: DC | PRN
  Administered 2018-09-28: 10 mg via INTRAVENOUS

## 2018-09-28 MED ORDER — CEFAZOLIN SODIUM-DEXTROSE 1-4 GM/50ML-% IV SOLN
1.0000 g | Freq: Three times a day (TID) | INTRAVENOUS | Status: AC
  Administered 2018-09-28 – 2018-09-29 (×2): 1 g via INTRAVENOUS
  Filled 2018-09-28 (×2): qty 50

## 2018-09-28 MED ORDER — CEFAZOLIN SODIUM-DEXTROSE 2-4 GM/100ML-% IV SOLN
INTRAVENOUS | Status: AC
  Filled 2018-09-28: qty 100

## 2018-09-28 MED ORDER — PHENOL 1.4 % MT LIQD
1.0000 | OROMUCOSAL | Status: DC | PRN
  Filled 2018-09-28: qty 177

## 2018-09-28 MED ORDER — FENTANYL CITRATE (PF) 100 MCG/2ML IJ SOLN
INTRAMUSCULAR | Status: AC
  Filled 2018-09-28: qty 2

## 2018-09-28 MED ORDER — FENTANYL CITRATE (PF) 100 MCG/2ML IJ SOLN
25.0000 ug | INTRAMUSCULAR | Status: DC | PRN
  Administered 2018-09-28 (×2): 50 ug via INTRAVENOUS

## 2018-09-28 MED ORDER — POLYETHYLENE GLYCOL 3350 17 G PO PACK: 17.0000 g | PACK | Freq: Every day | ORAL | Status: DC

## 2018-09-28 MED ORDER — SODIUM CHLORIDE 0.9% FLUSH: 3.0000 mL | Freq: Two times a day (BID) | INTRAVENOUS | Status: DC

## 2018-09-28 MED ORDER — ROCURONIUM BROMIDE 100 MG/10ML IV SOLN
INTRAVENOUS | Status: DC | PRN
  Administered 2018-09-28: 20 mg via INTRAVENOUS
  Administered 2018-09-28: 50 mg via INTRAVENOUS

## 2018-09-28 MED ORDER — METHOCARBAMOL 500 MG PO TABS
ORAL_TABLET | ORAL | Status: AC
  Filled 2018-09-28: qty 1

## 2018-09-28 MED ORDER — ONDANSETRON HCL 4 MG PO TABS: 4.0000 mg | ORAL_TABLET | Freq: Four times a day (QID) | ORAL | Status: DC | PRN

## 2018-09-28 MED ORDER — ACETAMINOPHEN 325 MG PO TABS: 650.0000 mg | ORAL_TABLET | ORAL | Status: DC | PRN

## 2018-09-28 MED ORDER — ACETAMINOPHEN 500 MG PO TABS: 1000.0000 mg | ORAL_TABLET | Freq: Once | ORAL | Status: DC

## 2018-09-28 MED ORDER — SODIUM CHLORIDE 0.9% FLUSH: 3.0000 mL | INTRAVENOUS | Status: DC | PRN

## 2018-09-28 MED ORDER — MIDAZOLAM HCL 5 MG/5ML IJ SOLN
INTRAMUSCULAR | Status: DC | PRN
  Administered 2018-09-28: 2 mg via INTRAVENOUS

## 2018-09-28 MED ORDER — ACETAMINOPHEN 160 MG/5ML PO SOLN: 325.0000 mg | ORAL | Status: DC | PRN

## 2018-09-28 MED ORDER — METHOCARBAMOL 500 MG PO TABS
500.0000 mg | ORAL_TABLET | Freq: Four times a day (QID) | ORAL | Status: DC | PRN
  Administered 2018-09-28 – 2018-09-29 (×2): 500 mg via ORAL
  Filled 2018-09-28: qty 1

## 2018-09-28 MED ORDER — DEXAMETHASONE SODIUM PHOSPHATE 10 MG/ML IJ SOLN
INTRAMUSCULAR | Status: AC
  Filled 2018-09-28: qty 1

## 2018-09-28 MED ORDER — PHENYLEPHRINE 40 MCG/ML (10ML) SYRINGE FOR IV PUSH (FOR BLOOD PRESSURE SUPPORT)
PREFILLED_SYRINGE | INTRAVENOUS | Status: AC
  Filled 2018-09-28: qty 10

## 2018-09-28 MED ORDER — ACETAMINOPHEN 650 MG RE SUPP: 650.0000 mg | RECTAL | Status: DC | PRN

## 2018-09-28 MED ORDER — FENTANYL CITRATE (PF) 100 MCG/2ML IJ SOLN
INTRAMUSCULAR | Status: DC | PRN
  Administered 2018-09-28 (×2): 50 ug via INTRAVENOUS
  Administered 2018-09-28 (×2): 100 ug via INTRAVENOUS
  Administered 2018-09-28: 50 ug via INTRAVENOUS

## 2018-09-28 MED ORDER — PROPOFOL 10 MG/ML IV BOLUS
INTRAVENOUS | Status: DC | PRN
  Administered 2018-09-28: 100 mg via INTRAVENOUS

## 2018-09-28 MED ORDER — PHENYLEPHRINE HCL 10 MG/ML IJ SOLN
INTRAMUSCULAR | Status: DC | PRN
  Administered 2018-09-28 (×2): 80 ug via INTRAVENOUS

## 2018-09-28 MED ORDER — MIDAZOLAM HCL 2 MG/2ML IJ SOLN
INTRAMUSCULAR | Status: AC
  Filled 2018-09-28: qty 2

## 2018-09-28 MED ORDER — BUPIVACAINE-EPINEPHRINE 0.25% -1:200000 IJ SOLN
INTRAMUSCULAR | Status: DC | PRN
  Administered 2018-09-28: 6 mL

## 2018-09-28 MED ORDER — MENTHOL 3 MG MT LOZG: 1.0000 | LOZENGE | OROMUCOSAL | Status: DC | PRN

## 2018-09-28 MED ORDER — BUPIVACAINE HCL (PF) 0.25 % IJ SOLN
INTRAMUSCULAR | Status: AC
  Filled 2018-09-28: qty 10

## 2018-09-28 MED ORDER — ONDANSETRON HCL 4 MG/2ML IJ SOLN
INTRAMUSCULAR | Status: DC | PRN
  Administered 2018-09-28: 4 mg via INTRAVENOUS

## 2018-09-28 MED ORDER — LIDOCAINE 2% (20 MG/ML) 5 ML SYRINGE
INTRAMUSCULAR | Status: AC
  Filled 2018-09-28: qty 10

## 2018-09-28 SURGICAL SUPPLY — 57 items
BENZOIN TINCTURE PRP APPL 2/3 (GAUZE/BANDAGES/DRESSINGS) ×3 IMPLANT
BIT DRILL SKYLINE 12MM (BIT) IMPLANT
BLADE CLIPPER SURG (BLADE) IMPLANT
BONE CERV LORDOTIC 14.5X12X6 (Bone Implant) ×3 IMPLANT
BONE CERV LORDOTIC 14.5X12X7 (Bone Implant) ×3 IMPLANT
BUR ROUND FLUTED 4 SOFT TCH (BURR) IMPLANT
BUR ROUND FLUTED 4MM SOFT TCH (BURR)
CLOSURE WOUND 1/2 X4 (GAUZE/BANDAGES/DRESSINGS) ×1
COLLAR CERV LO CONTOUR FIRM DE (SOFTGOODS) IMPLANT
CORD BIPOLAR FORCEPS 12FT (ELECTRODE) ×3 IMPLANT
COVER SURGICAL LIGHT HANDLE (MISCELLANEOUS) ×3 IMPLANT
COVER WAND RF STERILE (DRAPES) ×3 IMPLANT
CRADLE DONUT ADULT HEAD (MISCELLANEOUS) ×3 IMPLANT
DRAPE C-ARM 42X72 X-RAY (DRAPES) ×3 IMPLANT
DRAPE HALF SHEET 40X57 (DRAPES) ×3 IMPLANT
DRAPE MICROSCOPE LEICA (MISCELLANEOUS) ×3 IMPLANT
DRILL BIT SKYLINE 12MM (BIT) ×2
DURAPREP 6ML APPLICATOR 50/CS (WOUND CARE) ×3 IMPLANT
ELECT COATED BLADE 2.86 ST (ELECTRODE) ×3 IMPLANT
ELECT REM PT RETURN 9FT ADLT (ELECTROSURGICAL) ×3
ELECTRODE REM PT RTRN 9FT ADLT (ELECTROSURGICAL) ×1 IMPLANT
EVACUATOR 1/8 PVC DRAIN (DRAIN) ×5 IMPLANT
GAUZE SPONGE 4X4 12PLY STRL (GAUZE/BANDAGES/DRESSINGS) ×3 IMPLANT
GLOVE BIOGEL PI IND STRL 8 (GLOVE) ×2 IMPLANT
GLOVE BIOGEL PI INDICATOR 8 (GLOVE) ×6
GLOVE ECLIPSE 8.0 STRL XLNG CF (GLOVE) ×2 IMPLANT
GLOVE ORTHO TXT STRL SZ7.5 (GLOVE) ×6 IMPLANT
GOWN STRL REUS W/ TWL LRG LVL3 (GOWN DISPOSABLE) ×1 IMPLANT
GOWN STRL REUS W/ TWL XL LVL3 (GOWN DISPOSABLE) ×1 IMPLANT
GOWN STRL REUS W/TWL 2XL LVL3 (GOWN DISPOSABLE) ×3 IMPLANT
GOWN STRL REUS W/TWL LRG LVL3 (GOWN DISPOSABLE) ×2
GOWN STRL REUS W/TWL XL LVL3 (GOWN DISPOSABLE) ×2
GRAFT BNE SPCR VG2 14.5X12X6 (Bone Implant) IMPLANT
GRAFT BNE SPCR VG2 14.5X12X7 (Bone Implant) IMPLANT
HEAD HALTER (SOFTGOODS) ×3 IMPLANT
HEMOSTAT SURGICEL 2X14 (HEMOSTASIS) IMPLANT
KIT BASIN OR (CUSTOM PROCEDURE TRAY) ×3 IMPLANT
KIT TURNOVER KIT B (KITS) ×3 IMPLANT
MANIFOLD NEPTUNE II (INSTRUMENTS) ×2 IMPLANT
NDL 25GX 5/8IN NON SAFETY (NEEDLE) ×1 IMPLANT
NEEDLE 25GX 5/8IN NON SAFETY (NEEDLE) ×3 IMPLANT
NS IRRIG 1000ML POUR BTL (IV SOLUTION) ×3 IMPLANT
PACK ORTHO CERVICAL (CUSTOM PROCEDURE TRAY) ×3 IMPLANT
PAD ARMBOARD 7.5X6 YLW CONV (MISCELLANEOUS) ×6 IMPLANT
PATTIES SURGICAL .5 X.5 (GAUZE/BANDAGES/DRESSINGS) IMPLANT
PIN TEMP SKYLINE THREADED (PIN) ×2 IMPLANT
PLATE TWO LEVEL SKYLINE 30MM (Plate) ×2 IMPLANT
RESTRAINT LIMB HOLDER UNIV (RESTRAINTS) IMPLANT
SCREW VARIABLE SELF TAP 12MM (Screw) ×12 IMPLANT
STRIP CLOSURE SKIN 1/2X4 (GAUZE/BANDAGES/DRESSINGS) ×2 IMPLANT
SURGIFLO W/THROMBIN 8M KIT (HEMOSTASIS) ×2 IMPLANT
SUT BONE WAX W31G (SUTURE) ×3 IMPLANT
SUT VIC AB 3-0 X1 27 (SUTURE) ×3 IMPLANT
SUT VICRYL 4-0 PS2 18IN ABS (SUTURE) ×6 IMPLANT
TOWEL OR 17X24 6PK STRL BLUE (TOWEL DISPOSABLE) ×3 IMPLANT
TOWEL OR 17X26 10 PK STRL BLUE (TOWEL DISPOSABLE) ×3 IMPLANT
TRAY FOLEY CATH SILVER 16FR (SET/KITS/TRAYS/PACK) IMPLANT

## 2018-09-28 NOTE — Transfer of Care (Signed)
Immediate Anesthesia Transfer of Care Note  Patient: Bailey Berry  Procedure(s) Performed: C5-6, C6-7 ANTERIOR CERVICAL DECOMPRESSION/DISCECTOMY ALLOGRAFT (N/A )  Patient Location: PACU  Anesthesia Type:General  Level of Consciousness: drowsy  Airway & Oxygen Therapy: Patient Spontanous Breathing and Patient connected to nasal cannula oxygen  Post-op Assessment: Report given to RN, Post -op Vital signs reviewed and stable and Patient moving all extremities  Post vital signs: Reviewed and stable  Last Vitals:  Vitals Value Taken Time  BP 140/86 09/28/2018  6:20 PM  Temp 36.5 C 09/28/2018  6:20 PM  Pulse 94 09/28/2018  6:22 PM  Resp 11 09/28/2018  6:22 PM  SpO2 100 % 09/28/2018  6:22 PM  Vitals shown include unvalidated device data.  Last Pain:  Vitals:   09/28/18 1820  TempSrc:   PainSc: (P) Asleep      Patients Stated Pain Goal: 3 (83/66/29 4765)  Complications: No apparent anesthesia complications

## 2018-09-28 NOTE — Progress Notes (Signed)
Orthopedic Tech Progress Note Patient Details:  Bailey Berry 09-Jul-1961 [id number]  Patient ID: Bailey Berry, female   DOB: 1961-01-04, 58 y.o.   MRN: [id number] rn says pt has collar.  [name] 09/28/2018, 8:24 PM

## 2018-09-28 NOTE — Anesthesia Procedure Notes (Signed)
Procedure Name: Intubation Date/Time: 09/28/2018 3:50 PM Performed by: Erva Koke T, CRNA Pre-anesthesia Checklist: Patient identified, Emergency Drugs available, Suction available and Patient being monitored Patient Re-evaluated:Patient Re-evaluated prior to induction Oxygen Delivery Method: Circle system utilized Preoxygenation: Pre-oxygenation with 100% oxygen Induction Type: IV induction Ventilation: Mask ventilation without difficulty Laryngoscope Size: Glidescope and 4 Tube type: Oral Tube size: 7.5 mm Number of attempts: 1 Airway Equipment and Method: Patient positioned with wedge pillow,  Stylet and Video-laryngoscopy Placement Confirmation: ETT inserted through vocal cords under direct vision,  positive ETCO2 and breath sounds checked- equal and bilateral Secured at: 21 cm Tube secured with: Tape Dental Injury: Teeth and Oropharynx as per pre-operative assessment

## 2018-09-28 NOTE — H&P (Signed)
Office Visit Note              Patient: Bailey Berry                                       Date of Birth: 05-27-1961                                                      MRN: [id number] Visit Date: 09/15/2018                                                                     Requested by: Adrian Prows, [place] [address] PCP: Adrian Prows, PA-C   Assessment & Plan: Visit Diagnoses:  1. Foraminal stenosis of cervical region   2. Spinal stenosis of cervical region   3. Other spondylosis with radiculopathy, cervical region     Plan: Cervical spondylosis with severe foraminal stenosis C5-6 greater than C6-7 on the right which corresponds with her symptoms.  She has some hemicord right side flattening at C5-6 as well.  Moderate right and left foraminal stenosis at C6-7 with moderate central stenosis C6-7.  Plan will be 2 level cervical fusion at C5-6 C6-7 anteriorly with allograft and plate.  Overnight stay in the hospital.  We discussed operative technique looked at x-rays, pictures of the incision discussed risks including dysphasia, dysphonia, reoperation, pseudoarthrosis, possible need for posterior fusion if she had a symptomatic pseudoarthrosis.  Postoperative collar x6 weeks.  Procedure discussed questions were elicited and answered.  Patient understands request we proceed.  Follow-Up Instructions: No follow-ups on file.   Orders:  No orders of the defined types were placed in this encounter.  No orders of the defined types were placed in this encounter.     Procedures: No procedures performed   Clinical Data: No additional findings.   Subjective:     Chief Complaint  Patient presents with  . Neck - Pain, Follow-up    MRI Cervical Spine Review    HPI 58 year old female returns with cervical spondylosis severe foraminal stenosis C5-6 C6-7 with persistent pain requiring Percocet prednisone and Neurontin.   She states she is unable to tolerate the pain without the Percocet.  New MRI scan is available for review today with patient.  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  Review of Systems 14 point review of system positive for CPPD, cervical spondylosis.  Right breast ductal carcinoma in situ.  Bilateral carpal tunnel syndrome.  Vitamin D deficiency, hyperlipidemia.  Negative cardiovascular endocrine renal GI GU.  History of Bell's palsy right face.   Objective: Vital Signs: BP 119/75   Pulse 76   Ht 5\' 7"  (1.702 m)   Wt 170 lb (77.1 kg)   BMI 26.63 kg/m   Physical Exam Constitutional:      Appearance: She is well-developed.  HENT:     Head: Normocephalic.  Right Ear: External ear normal.     Left Ear: External ear normal.  Eyes:     Pupils: Pupils are equal, round, and reactive to light.  Neck:     Thyroid: No thyromegaly.     Trachea: No tracheal deviation.  Cardiovascular:     Rate and Rhythm: Normal rate.  Pulmonary:     Effort: Pulmonary effort is normal.  Abdominal:     Palpations: Abdomen is soft.  Skin:    General: Skin is warm and dry.  Neurological:     Mental Status: She is alert and oriented to person, place, and time.  Psychiatric:        Behavior: Behavior normal.     Ortho Exam reproduction of pain with cervical compression some relief with distraction.  Absent brachioradialis reflex on the right.  Severe brachial plexus tenderness on the right negative on the left positive Spurling on the right.  No lower extremity hyperreflexia.  No impingement the shoulders.  Decreased sensation C6 distribution right hand versus left.  One half grade right wrist extension weakness, normal left side wrist extension flexion.  No interossei weakness.  Her fundi are strong.  Specialty Comments:  No specialty comments available.  Imaging: CLINICAL DATA: Initial evaluation for cervical spondylolysis with right-sided  foraminal stenosis at C5-6 and C6-7.  EXAM: MRI CERVICAL SPINE WITHOUT CONTRAST  TECHNIQUE: Multiplanar, multisequence MR imaging of the cervical spine was performed. No intravenous contrast was administered.  COMPARISON: Prior MRI from 07/27/2017.  FINDINGS: Alignment: Straightening of the normal cervical lordosis. Trace retrolisthesis of C6 on C7, stable.  Vertebrae: Vertebral body heights maintained without evidence for acute or chronic fracture. Bone marrow signal intensity within normal limits. No discrete or worrisome osseous lesions. Mild reactive endplate changes present about the C5-6 and C6-7 interspaces. No other abnormal marrow edema.  Cord: Signal intensity within the cervical spinal cord is normal.  Posterior Fossa, vertebral arteries, paraspinal tissues: Visualized brain and posterior fossa within normal limits. Craniocervical junction normal. Paraspinous and prevertebral soft tissues within normal limits. Normal intravascular flow voids seen within the vertebral arteries bilaterally.  Disc levels:  C2-C3: Negative interspace. Mild right-sided facet hypertrophy. No stenosis.  C3-C4: Mild diffuse disc bulge with bilateral uncovertebral hypertrophy. Mild flattening of the ventral CSF without significant spinal stenosis. Mild bilateral C4 foraminal narrowing, left slightly worse than right, stable.  C4-C5: Mild disc bulge with right-sided uncovertebral hypertrophy. No significant spinal stenosis. Mild right C5 foraminal narrowing, unchanged. No significant left foraminal encroachment.  C5-C6: Right eccentric disc osteophyte complex with right greater than left uncovertebral spurring. Flattening of the ventral thecal sac, most notable on the right. Mild flattening of the right ventral spinal cord without cord signal abnormality. Severe right with mild left C6 foraminal stenosis. Appearance relatively similar to previous.  C6-C7: Chronic  circumferential disc osteophyte complex with intervertebral disc space narrowing. Broad posterior component flattens and partially effaces the ventral thecal sac. Mild cord flattening without cord signal changes. Resultant mild to moderate spinal stenosis. Moderate bilateral C7 foraminal narrowing, left slightly worse than right. Appearance relatively similar to previous.  C7-T1: Minimal annular disc bulge. Mild ligament flavum hypertrophy. No significant canal or foraminal stenosis.  Visualized upper thoracic spine demonstrates no significant finding.  IMPRESSION: 1. Overall no significant interval change in appearance of the cervical spine as compared to previous MRI from 07/27/2017. 2. Right eccentric disc osteophyte complex at C5-6 with secondary flattening of the right hemi cord and severe right C6 foraminal stenosis.  3. Circumferential disc osteophyte at C6-7 with resultant mild to moderate spinal stenosis with moderate bilateral C7 foraminal narrowing. 4. Mild right C5 foraminal stenosis related to disc bulge and uncovertebral hypertrophy.   Electronically Signed By: Jeannine Boga M.D. On: 09/12/2018 07:30   PMFS History:     Patient Active Problem List   Diagnosis Date Noted  . Spinal stenosis of cervical region 09/15/2018  . Foraminal stenosis of cervical region 09/15/2018  . Other spondylosis with radiculopathy, cervical region 09/07/2018  . Personal history of calcium pyrophosphate deposition disease (CPPD) 06/23/2018  . Hyperlipidemia 06/23/2018  . Vitamin D deficiency 06/23/2018  . Bilateral carpal tunnel syndrome 10/15/2017  . Double crush syndrome 10/15/2017  . Osteoarthritis of spine with radiculopathy, cervical region 06/16/2017  . Ductal carcinoma in situ (DCIS) of right breast 12/06/2014   No past medical history on file.  No family history on file.  No past surgical history on file. Social History       Occupational History   . Not on file  Tobacco Use  . Smoking status: Never Smoker  . Smokeless tobacco: Never Used  Substance and Sexual Activity  . Alcohol use: Not on file  . Drug use: Not on file  . Sexual activity: Not on file

## 2018-09-28 NOTE — Interval H&P Note (Signed)
History and Physical Interval Note:  09/28/2018 2:10 PM  Bailey Berry  has presented today for surgery, with the diagnosis of c5-6, c6-7 spondylosis, stenosis  The various methods of treatment have been discussed with the patient and family. After consideration of risks, benefits and other options for treatment, the patient has consented to  Procedure(s): C5-6, C6-7 ANTERIOR CERVICAL DECOMPRESSION/DISCECTOMY ALLOGRAFT (N/A) as a surgical intervention .  The patient's history has been reviewed, patient examined, no change in status, stable for surgery.  I have reviewed the patient's chart and labs.  Questions were answered to the patient's satisfaction.     Marybelle Killings

## 2018-09-28 NOTE — Anesthesia Postprocedure Evaluation (Signed)
Anesthesia Post Note  Patient: Bailey Berry  Procedure(s) Performed: C5-6, C6-7 ANTERIOR CERVICAL DECOMPRESSION/DISCECTOMY ALLOGRAFT (N/A )     Patient location during evaluation: PACU Anesthesia Type: General Level of consciousness: awake and alert Pain management: pain level controlled Vital Signs Assessment: post-procedure vital signs reviewed and stable Respiratory status: spontaneous breathing, nonlabored ventilation, respiratory function stable and patient connected to nasal cannula oxygen Cardiovascular status: blood pressure returned to baseline and stable Postop Assessment: no apparent nausea or vomiting Anesthetic complications: no    Last Vitals:  Vitals:   09/28/18 1920 09/28/18 1947  BP: (!) 155/87 (!) 161/91  Pulse: 91 88  Resp: 15 16  Temp: 36.6 C 36.6 C  SpO2: 100% 95%    Last Pain:  Vitals:   09/28/18 2000  TempSrc:   PainSc: 5                  Barnet Glasgow

## 2018-09-28 NOTE — Plan of Care (Signed)
  Problem: Education: Goal: Ability to verbalize activity precautions or restrictions will improve Outcome: Progressing Goal: Knowledge of the prescribed therapeutic regimen will improve Outcome: Progressing Goal: Understanding of discharge needs will improve Outcome: Progressing   Problem: Pain Management: Goal: Pain level will decrease Outcome: Progressing   Problem: Bladder/Genitourinary: Goal: Urinary functional status for postoperative course will improve Outcome: Progressing   

## 2018-09-28 NOTE — Anesthesia Preprocedure Evaluation (Addendum)
Anesthesia Evaluation  Patient identified by MRN, date of birth, ID band Patient awake    Reviewed: Allergy & Precautions, H&P , NPO status , Patient's Chart, lab work & pertinent test results  Airway Mallampati: II  TM Distance: >3 FB Neck ROM: Full    Dental no notable dental hx. (+) Teeth Intact, Dental Advisory Given   Pulmonary    Pulmonary exam normal breath sounds clear to auscultation       Cardiovascular negative cardio ROS Normal cardiovascular exam Rhythm:Regular Rate:Normal     Neuro/Psych PSYCHIATRIC DISORDERS Depression Slight loss of grip bilaterally pre op  Neuromuscular disease    GI/Hepatic negative GI ROS, Neg liver ROS,   Endo/Other  negative endocrine ROS  Renal/GU negative Renal ROS     Musculoskeletal  (+) Arthritis ,   Abdominal   Peds  Hematology negative hematology ROS (+)   Anesthesia Other Findings   Reproductive/Obstetrics                             Anesthesia Physical Anesthesia Plan  ASA: I  Anesthesia Plan: General   Post-op Pain Management:    Induction: Intravenous  PONV Risk Score and Plan: Treatment may vary due to age or medical condition, Ondansetron, Dexamethasone, Scopolamine patch - Pre-op and Midazolam  Airway Management Planned: Oral ETT and Video Laryngoscope Planned  Additional Equipment:   Intra-op Plan:   Post-operative Plan: Extubation in OR  Informed Consent: I have reviewed the patients History and Physical, chart, labs and discussed the procedure including the risks, benefits and alternatives for the proposed anesthesia with the patient or authorized representative who has indicated his/her understanding and acceptance.     Dental advisory given  Plan Discussed with: CRNA  Anesthesia Plan Comments:         Anesthesia Quick Evaluation

## 2018-09-28 NOTE — Op Note (Signed)
Preop diagnosis: Cervical spondylosis with stenosis C5-6, C6-7  Postop diagnosis: Same  Procedure: C5-6, C6-7 anterior cervical discectomy and fusion, allograft and plate.  Surgeon: Rodell Perna, MD  Assistant: Benjiman Core, PA-C medically necessary and present for the critical portions of the surgery with decompression of the dura and graft placement.  Second assist [place], RNFA  Drains: 1 Hemovac neck  Anesthesia General oral tracheal +6 cc Marcaine skin local  Complications: None  Implants: Synthes skyline 30 mm plate with 12 mm screws x6.VG 2 6 mm graft at C5-6 level and 7 mm graft placed at C6-7 level cortical cancellus allograft.  Procedure after induction general esthesia arms tucked at the side with yellow pads wrist restraints had ultra traction gel bag underneath the shoulder blades neck was prepped with DuraPrep area squared with towel sterile skin marker in the skin fold based on palpable landmarks and Betadine Steri-Drape standard the head and thyroid sheets and drapes.  Timeout procedure was completed Ancef prophylaxis.  Incision started to midline extending the left platysma was divided in line with the fibers blunt dissection below the omohyoid was performed down to the large prominent spur at C6-7 which was about a centimeter proud from the normal cortex.  C5-6 was identified above it and a short 25 needle was placed confirmed with lateral C arm once it was sterilely draped that this was the appropriate level.  C5-6 had some hemicord compression on the right side with stenosis and this was surgically addressed first.  Spurs were removed operative microscope was draped and brought in and microdissection progressed back to the posterior longitudinal ligament.  1 to 2 mm Kerrisons were used to remove the ligament dura was decompressed overhanging spurs particular in the right side were removed uncovertebral joints were stripped some Surgi-Flo was placed for little bit of epidural  oozing once it was dry trial sizer showed a 6 mm graft was nice tight fit had ultra traction was pulled by the CRNA the graft was countersunk 44mm.  Prior bur was used to remove the prominent anterior spurs at C6-7 and discectomy was performed at this level identical to the C5-6 level.  Submillimeter graft gave a better fit at this level.  Graft was countersunk 2 mm.  30 mm plate was selected adjusted screws and then the plate was adjusted swinging over slightly so that it lined up well on AP x-ray nearly perfectly in the midline.  Once this was confirmed AP and lateral all additional screws were filled final spot pictures were taken locking screwdriver was used to lock all sick screws and Hemovac was placed in and out technique in line with the skin incision on the left 3-0 Vicryl in the platysma 4-0 Vicryl subcuticular closure tincture benzoin Steri-Strips Saige Busby infiltration postop 4 x 4's tape and soft cervical collar.

## 2018-09-29 DIAGNOSIS — M47812 Spondylosis without myelopathy or radiculopathy, cervical region: Secondary | ICD-10-CM | POA: Diagnosis not present

## 2018-09-29 MED ORDER — OXYCODONE-ACETAMINOPHEN 5-325 MG PO TABS: 1.0000 | ORAL_TABLET | Freq: Four times a day (QID) | ORAL | 0 refills | Status: DC | PRN

## 2018-09-29 MED ORDER — METHOCARBAMOL 500 MG PO TABS: 500.0000 mg | ORAL_TABLET | Freq: Three times a day (TID) | ORAL | 1 refills | Status: DC | PRN

## 2018-09-29 NOTE — Progress Notes (Signed)
Orthopedic Tech Progress Note Patient Details:  Bailey Berry [birth year] [id number]  Ortho Devices Type of Ortho Device: Soft collar       [name] 09/29/2018, 9:05 AM

## 2018-09-29 NOTE — Progress Notes (Signed)
   Subjective: 1 Day Post-Op Procedure(s) (LRB): C5-6, C6-7 ANTERIOR CERVICAL DECOMPRESSION/DISCECTOMY ALLOGRAFT (N/A) Patient reports pain as mild.    Objective: Vital signs in last 24 hours: Temp:  [97.5 F (36.4 C)-98.9 F (37.2 C)] 98.9 F (37.2 C) (01/21 0730) Pulse Rate:  [78-97] 85 (01/21 0730) Resp:  [10-20] 16 (01/21 0730) BP: (121-161)/(75-91) 121/75 (01/21 0730) SpO2:  [95 %-100 %] 97 % (01/21 0730) Weight:  [77.1 kg] 77.1 kg (01/20 1301)  Intake/Output from previous day: 01/20 0701 - 01/21 0700 In: 1503 [I.V.:1503] Out: 180 [Drains:30; Blood:150] Intake/Output this shift: Total I/O In: -  Out: 15 [Drains:15]  No results for input(s): HGB in the last 72 hours. No results for input(s): WBC, RBC, HCT, PLT in the last 72 hours. No results for input(s): NA, K, CL, CO2, BUN, CREATININE, GLUCOSE, CALCIUM in the last 72 hours. No results for input(s): LABPT, INR in the last 72 hours.  Neurologically intact Dg Cervical Spine 2-3 Views  Result Date: 09/28/2018 CLINICAL DATA:  Cervical fusion EXAM: DG C-ARM 61-120 MIN; CERVICAL SPINE - 2-3 VIEW COMPARISON:  09/12/2018 FLUOROSCOPY TIME:  Fluoroscopy Time:  17 seconds Radiation Exposure Index (if provided by the fluoroscopic device): Not available Number of Acquired Spot Images: 2 FINDINGS: Interbody fusion is noted at C5-6 and C6-7 with anterior fixation at both levels. No acute bony abnormality is seen. No soft tissue changes are noted. IMPRESSION: Cervical fusion at C5-6 and C6-7. Electronically Signed   By: Inez Catalina M.D.   On: 09/28/2018 18:05   Dg C-arm 1-60 Min  Result Date: 09/28/2018 CLINICAL DATA:  Cervical fusion EXAM: DG C-ARM 61-120 MIN; CERVICAL SPINE - 2-3 VIEW COMPARISON:  09/12/2018 FLUOROSCOPY TIME:  Fluoroscopy Time:  17 seconds Radiation Exposure Index (if provided by the fluoroscopic device): Not available Number of Acquired Spot Images: 2 FINDINGS: Interbody fusion is noted at C5-6 and C6-7 with anterior  fixation at both levels. No acute bony abnormality is seen. No soft tissue changes are noted. IMPRESSION: Cervical fusion at C5-6 and C6-7. Electronically Signed   By: Inez Catalina M.D.   On: 09/28/2018 18:05    Assessment/Plan: 1 Day Post-Op Procedure(s) (LRB): C5-6, C6-7 ANTERIOR CERVICAL DECOMPRESSION/DISCECTOMY ALLOGRAFT (N/A) Plan:  HV drain removed, dressing change before discharge. Office one week.   Marybelle Killings 09/29/2018, 7:38 AM

## 2018-09-29 NOTE — Progress Notes (Signed)
Pt doing well. Pt and husband given D/C instructions with verbal understanding. Pt's incision is clean and dry with no sign of infection. Rx's were sent to Pt's pharmacy by MD. Pt's IV was removed prior to D/C. Pt D/C'd home via wheelchair @ 1015 per MD order. Pt is stable @ D/C and has no other needs at this time. Holli Humbles, RN

## 2018-09-30 ENCOUNTER — Encounter (HOSPITAL_COMMUNITY): Payer: Self-pay | Admitting: Orthopaedic Surgery

## 2018-10-06 ENCOUNTER — Ambulatory Visit (INDEPENDENT_AMBULATORY_CARE_PROVIDER_SITE_OTHER): Payer: BLUE CROSS/BLUE SHIELD | Admitting: Orthopaedic Surgery

## 2018-10-06 ENCOUNTER — Ambulatory Visit (INDEPENDENT_AMBULATORY_CARE_PROVIDER_SITE_OTHER): Payer: BLUE CROSS/BLUE SHIELD

## 2018-10-06 ENCOUNTER — Encounter (INDEPENDENT_AMBULATORY_CARE_PROVIDER_SITE_OTHER): Payer: Self-pay | Admitting: Orthopaedic Surgery

## 2018-10-06 VITALS — BP 137/89 | HR 91 | Ht 66.0 in | Wt 170.0 lb

## 2018-10-06 DIAGNOSIS — Z981 Arthrodesis status: Secondary | ICD-10-CM

## 2018-10-06 MED ORDER — OXYCODONE-ACETAMINOPHEN 5-325 MG PO TABS: 1.0000 | ORAL_TABLET | Freq: Four times a day (QID) | ORAL | 0 refills | Status: AC | PRN

## 2018-10-06 NOTE — Progress Notes (Signed)
   Post-Op Visit Note   Patient: Bailey Berry           Date of Birth: 01/02/1961           MRN: [id number] Visit Date: 10/06/2018 PCP: Adrian Prows, PA-C   Assessment & Plan: Post 2 level fusion she still has some problems in her right hand involving the ring and small finger.  Steri-Strips removed she got lightheaded.  Incision looks good we will apply new Steri-Strips.  Chief Complaint:  Chief Complaint  Patient presents with  . Neck - Routine Post Op    09/28/2018 C5-6, C6-7 ACDF, Allograft, Plate   Visit Diagnoses:  1. History of fusion of cervical spine     Plan: Steri-Strips changed.  New collar covers given.  X-ray showed good position alignment of graft plate and screws.  She still having some pulling sensation in her shoulders and some problems in her right hand in the ring and small finger slight discomfort over the ulnar nerve.  Electrical test did not show any cubital tunnel syndrome.  Follow-Up Instructions: No follow-ups on file.   Orders:  Orders Placed This Encounter  Procedures  . XR Cervical Spine 2 or 3 views   Meds ordered this encounter  Medications  . oxyCODONE-acetaminophen (PERCOCET) 5-325 MG tablet    Sig: Take 1-2 tablets by mouth every 6 (six) hours as needed for severe pain.    Dispense:  30 tablet    Refill:  0    Imaging: Xr Cervical Spine 2 Or 3 Views  Result Date: 10/06/2018 AP lateral cervical spine x-rays are obtained and reviewed.  This shows to level cervical fusion C5-6 C6-7 with allograft and plate.  Good position plate and screws. Impression: Satisfactory 2 level cervical fusion C5-6 C6-7.   PMFS History: Patient Active Problem List   Diagnosis Date Noted  . Cervical stenosis of spine 09/28/2018  . Spinal stenosis of cervical region 09/15/2018  . Foraminal stenosis of cervical region 09/15/2018  . Other spondylosis with radiculopathy, cervical region 09/07/2018  . Personal history of calcium pyrophosphate deposition  disease (CPPD) 06/23/2018  . Hyperlipidemia 06/23/2018  . Vitamin D deficiency 06/23/2018  . Bilateral carpal tunnel syndrome 10/15/2017  . Double crush syndrome 10/15/2017  . Osteoarthritis of spine with radiculopathy, cervical region 06/16/2017  . 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 Arthritis    hands  . Bell's palsy   . Cancer [place])    breast  . Depression     No family history on file.  Past Surgical History:  Procedure Laterality Date  . ANTERIOR CERVICAL DECOMP/DISCECTOMY FUSION N/A 09/28/2018   Procedure: C5-6, C6-7 ANTERIOR CERVICAL DECOMPRESSION/DISCECTOMY ALLOGRAFT;  Surgeon: Marybelle Killings, MD;  Location: [facility];  Service: Orthopedics;  Laterality: N/A;  . APPENDECTOMY    . BREAST SURGERY Right    lumpectomy   . CERVICAL POLYPECTOMY    . COLONOSCOPY    . TUBAL LIGATION     Social History   Occupational History  . Not on file  Tobacco Use  . Smoking status: Never Smoker  . Smokeless tobacco: Never Used  Substance and Sexual Activity  . Alcohol use: Yes    Comment: rarely  . Drug use: Never  . Sexual activity: Not on file

## 2018-10-07 ENCOUNTER — Encounter (INDEPENDENT_AMBULATORY_CARE_PROVIDER_SITE_OTHER): Payer: Self-pay | Admitting: Orthopaedic Surgery

## 2018-10-07 DIAGNOSIS — Z981 Arthrodesis status: Secondary | ICD-10-CM | POA: Insufficient documentation

## 2018-10-09 NOTE — Discharge Summary (Signed)
Patient ID: Bailey Berry MRN: [id number] DOB/AGE: [birth year] 58 y.o.  Admit date: 09/28/2018 Discharge date: 10/09/2018  Admission Diagnoses:  Active Problems:   Cervical stenosis of spine   Discharge Diagnoses:  Active Problems:   Cervical stenosis of spine  status post Procedure(s): C5-6, C6-7 ANTERIOR CERVICAL DECOMPRESSION/DISCECTOMY ALLOGRAFT  Past Medical History:  Diagnosis Date  . Arthritis    hands  . Bell's palsy   . Cancer ([place])    breast  . Depression     Surgeries: Procedure(s): C5-6, C6-7 ANTERIOR CERVICAL DECOMPRESSION/DISCECTOMY ALLOGRAFT on 09/28/2018   Consultants:   Discharged Condition: Improved  Hospital Course: Bailey Berry is an 58 y.o. female who was admitted 09/28/2018 for operative treatment of cervical stenosis/hnp.  Patient failed conservative treatments (please see the history and physical for the specifics) and had severe unremitting pain that affects sleep, daily activities and work/hobbies. After pre-op clearance, the patient was taken to the operating room on 09/28/2018 and underwent  Procedure(s): C5-6, C6-7 ANTERIOR CERVICAL DECOMPRESSION/DISCECTOMY ALLOGRAFT.    Patient was given perioperative antibiotics:  Anti-infectives (From admission, onward)   Start     Dose/Rate Route Frequency Ordered Stop   09/28/18 2200  ceFAZolin (ANCEF) IVPB 1 g/50 mL premix     1 g 100 mL/hr over 30 Minutes Intravenous Every 8 hours 09/28/18 1928 09/29/18 0552   09/28/18 1310  ceFAZolin (ANCEF) 2-4 GM/100ML-% IVPB    Note to Pharmacy:  Merryl Hacker   : cabinet override      09/28/18 1310 09/29/18 0114       Patient was given sequential compression devices and early ambulation to prevent DVT.   Patient benefited maximally from hospital stay and there were no complications. At the time of discharge, the patient was urinating/moving their bowels without difficulty, tolerating a regular diet, pain is controlled with oral pain medications and  they have been cleared by PT/OT.   Recent vital signs: No data found.   Recent laboratory studies: No results for input(s): WBC, HGB, HCT, PLT, NA, K, CL, CO2, BUN, CREATININE, GLUCOSE, INR, CALCIUM in the last 72 hours.  Invalid input(s): PT, 2   Discharge Medications:   Allergies as of 09/29/2018   No Known Allergies     Medication List    STOP taking these medications   oxyCODONE-acetaminophen 5-325 MG tablet Commonly known as:  PERCOCET/ROXICET   predniSONE 10 MG tablet Commonly known as:  DELTASONE   tiZANidine 2 MG tablet Commonly known as:  ZANAFLEX   traMADol 50 MG tablet Commonly known as:  ULTRAM     TAKE these medications   gabapentin 300 MG capsule Commonly known as:  NEURONTIN Take 300 mg by mouth 3 (three) times daily as needed (neuropathy). What changed:  Another medication with the same name was changed. Make sure you understand how and when to take each.   gabapentin 100 MG capsule Commonly known as:  NEURONTIN Take 3 capsules (300 mg total) by mouth 3 (three) times daily. What changed:    when to take this  reasons to take this   lovastatin 20 MG tablet Commonly known as:  MEVACOR Take 20 mg by mouth at bedtime.   methocarbamol 500 MG tablet Commonly known as:  ROBAXIN Take 1 tablet (500 mg total) by mouth every 8 (eight) hours as needed for muscle spasms.   tamoxifen 20 MG tablet Commonly known as:  NOLVADEX Take 20 mg by mouth daily.   Vitamin D (Ergocalciferol) 1.25 MG (50000 UT) Caps capsule  Commonly known as:  DRISDOL Take 50,000 Units by mouth every 7 (seven) days.       Diagnostic Studies: Dg Chest 2 View  Result Date: 09/25/2018 CLINICAL DATA:  Preop cervical fusion EXAM: CHEST - 2 VIEW COMPARISON:  08/11/2009 FINDINGS: Heart and mediastinal contours are within normal limits. No focal opacities or effusions. No acute bony abnormality. IMPRESSION: No active cardiopulmonary disease. Electronically Signed   By: Rolm Baptise M.D.    On: 09/25/2018 08:20   Dg Cervical Spine 2-3 Views  Result Date: 09/28/2018 CLINICAL DATA:  Cervical fusion EXAM: DG C-ARM 61-120 MIN; CERVICAL SPINE - 2-3 VIEW COMPARISON:  09/12/2018 FLUOROSCOPY TIME:  Fluoroscopy Time:  17 seconds Radiation Exposure Index (if provided by the fluoroscopic device): Not available Number of Acquired Spot Images: 2 FINDINGS: Interbody fusion is noted at C5-6 and C6-7 with anterior fixation at both levels. No acute bony abnormality is seen. No soft tissue changes are noted. IMPRESSION: Cervical fusion at C5-6 and C6-7. Electronically Signed   By: Inez Catalina M.D.   On: 09/28/2018 18:05   Mr Cervical Spine W/o Contrast  Result Date: 09/12/2018 CLINICAL DATA:  Initial evaluation for cervical spondylolysis with right-sided foraminal stenosis at C5-6 and C6-7. EXAM: MRI CERVICAL SPINE WITHOUT CONTRAST TECHNIQUE: Multiplanar, multisequence MR imaging of the cervical spine was performed. No intravenous contrast was administered. COMPARISON:  Prior MRI from 07/27/2017. FINDINGS: Alignment: Straightening of the normal cervical lordosis. Trace retrolisthesis of C6 on C7, stable. Vertebrae: Vertebral body heights maintained without evidence for acute or chronic fracture. Bone marrow signal intensity within normal limits. No discrete or worrisome osseous lesions. Mild reactive endplate changes present about the C5-6 and C6-7 interspaces. No other abnormal marrow edema. Cord: Signal intensity within the cervical spinal cord is normal. Posterior Fossa, vertebral arteries, paraspinal tissues: Visualized brain and posterior fossa within normal limits. Craniocervical junction normal. Paraspinous and prevertebral soft tissues within normal limits. Normal intravascular flow voids seen within the vertebral arteries bilaterally. Disc levels: C2-C3: Negative interspace. Mild right-sided facet hypertrophy. No stenosis. C3-C4: Mild diffuse disc bulge with bilateral uncovertebral hypertrophy. Mild  flattening of the ventral CSF without significant spinal stenosis. Mild bilateral C4 foraminal narrowing, left slightly worse than right, stable. C4-C5: Mild disc bulge with right-sided uncovertebral hypertrophy. No significant spinal stenosis. Mild right C5 foraminal narrowing, unchanged. No significant left foraminal encroachment. C5-C6: Right eccentric disc osteophyte complex with right greater than left uncovertebral spurring. Flattening of the ventral thecal sac, most notable on the right. Mild flattening of the right ventral spinal cord without cord signal abnormality. Severe right with mild left C6 foraminal stenosis. Appearance relatively similar to previous. C6-C7: Chronic circumferential disc osteophyte complex with intervertebral disc space narrowing. Broad posterior component flattens and partially effaces the ventral thecal sac. Mild cord flattening without cord signal changes. Resultant mild to moderate spinal stenosis. Moderate bilateral C7 foraminal narrowing, left slightly worse than right. Appearance relatively similar to previous. C7-T1: Minimal annular disc bulge. Mild ligament flavum hypertrophy. No significant canal or foraminal stenosis. Visualized upper thoracic spine demonstrates no significant finding. IMPRESSION: 1. Overall no significant interval change in appearance of the cervical spine as compared to previous MRI from 07/27/2017. 2. Right eccentric disc osteophyte complex at C5-6 with secondary flattening of the right hemi cord and severe right C6 foraminal stenosis. 3. Circumferential disc osteophyte at C6-7 with resultant mild to moderate spinal stenosis with moderate bilateral C7 foraminal narrowing. 4. Mild right C5 foraminal stenosis related to disc bulge and uncovertebral  hypertrophy. Electronically Signed   By: Jeannine Boga M.D.   On: 09/12/2018 07:30   Dg C-arm 1-60 Min  Result Date: 09/28/2018 CLINICAL DATA:  Cervical fusion EXAM: DG C-ARM 61-120 MIN; CERVICAL  SPINE - 2-3 VIEW COMPARISON:  09/12/2018 FLUOROSCOPY TIME:  Fluoroscopy Time:  17 seconds Radiation Exposure Index (if provided by the fluoroscopic device): Not available Number of Acquired Spot Images: 2 FINDINGS: Interbody fusion is noted at C5-6 and C6-7 with anterior fixation at both levels. No acute bony abnormality is seen. No soft tissue changes are noted. IMPRESSION: Cervical fusion at C5-6 and C6-7. Electronically Signed   By: Inez Catalina M.D.   On: 09/28/2018 18:05   Xr Cervical Spine 2 Or 3 Views  Result Date: 10/06/2018 AP lateral cervical spine x-rays are obtained and reviewed.  This shows to level cervical fusion C5-6 C6-7 with allograft and plate.  Good position plate and screws. Impression: Satisfactory 2 level cervical fusion C5-6 C6-7.       Discharge Plan:  discharge to home  Disposition:     Signed: Benjiman Core  10/09/2018, 3:47 PM

## 2018-10-12 ENCOUNTER — Encounter (HOSPITAL_COMMUNITY): Payer: Self-pay | Admitting: Orthopaedic Surgery

## 2018-11-10 ENCOUNTER — Ambulatory Visit (INDEPENDENT_AMBULATORY_CARE_PROVIDER_SITE_OTHER): Payer: BLUE CROSS/BLUE SHIELD | Admitting: Orthopaedic Surgery

## 2018-11-10 ENCOUNTER — Ambulatory Visit (INDEPENDENT_AMBULATORY_CARE_PROVIDER_SITE_OTHER): Payer: BLUE CROSS/BLUE SHIELD

## 2018-11-10 ENCOUNTER — Encounter (INDEPENDENT_AMBULATORY_CARE_PROVIDER_SITE_OTHER): Payer: Self-pay | Admitting: Orthopaedic Surgery

## 2018-11-10 VITALS — BP 126/81 | HR 73 | Ht 66.0 in | Wt 170.0 lb

## 2018-11-10 DIAGNOSIS — Z981 Arthrodesis status: Secondary | ICD-10-CM

## 2018-11-10 NOTE — Progress Notes (Signed)
   Post-Op Visit Note   Patient: Bailey Berry           Date of Birth: 1961-04-10           MRN: [id number] Visit Date: 11/10/2018 PCP: Adrian Prows, PA-C   Assessment & Plan: Patient turns post C5-6 C6-7 anterior cervical discectomy and fusion on 09/28/2018.  She still has some pain in her right forearm none in her hand.  Incision is well-healed.  She was at [place] yesterday and took her collar off.  Today flexion-extension lateral C-spine x-rays demonstrate no motion at C5-6 but she has slight motion at C6-7.  I recommend she stay in the collar for 3 more weeks and then she can return for repeat two-view x-rays cervical spine lateral flexion extension x-rays only.  Chief Complaint:  Chief Complaint  Patient presents with  . Neck - Follow-up    09/28/2018 C5-6, C6-7 ACDF, Allograft, Plate   Visit Diagnoses:  1. History of fusion of cervical spine     Plan: Pain return 3 weeks lateral cervical spine flexion-extension x-ray.  Follow-Up Instructions: Return in about 4 weeks (around 12/08/2018).   Orders:  Orders Placed This Encounter  Procedures  . XR Cervical Spine 2 or 3 views   No orders of the defined types were placed in this encounter.   Imaging: No results found.  PMFS History: Patient Active Problem List   Diagnosis Date Noted  . History of fusion of cervical spine 10/07/2018  . Cervical stenosis of spine 09/28/2018  . Spinal stenosis of cervical region 09/15/2018  . Personal history of calcium pyrophosphate deposition disease (CPPD) 06/23/2018  . Hyperlipidemia 06/23/2018  . Vitamin D deficiency 06/23/2018  . Bilateral carpal tunnel syndrome 10/15/2017  . Double crush syndrome 10/15/2017  . 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 Arthritis    hands  . Bell's palsy   . Cancer [facility])    breast  . Depression     No family history on file.  Past Surgical History:  Procedure Laterality  Date  . ANTERIOR CERVICAL DECOMP/DISCECTOMY FUSION N/A 09/28/2018   Procedure: C5-6, C6-7 ANTERIOR CERVICAL DECOMPRESSION/DISCECTOMY ALLOGRAFT;  Surgeon: Marybelle Killings, MD;  Location: [facility];  Service: Orthopedics;  Laterality: N/A;  . APPENDECTOMY    . BREAST SURGERY Right    lumpectomy   . CERVICAL POLYPECTOMY    . COLONOSCOPY    . TUBAL LIGATION     Social History   Occupational History  . Not on file  Tobacco Use  . Smoking status: Never Smoker  . Smokeless tobacco: Never Used  Substance and Sexual Activity  . Alcohol use: Yes    Comment: rarely  . Drug use: Never  . Sexual activity: Not on file

## 2018-12-02 ENCOUNTER — Telehealth (INDEPENDENT_AMBULATORY_CARE_PROVIDER_SITE_OTHER): Payer: Self-pay | Admitting: Radiology

## 2018-12-02 NOTE — Telephone Encounter (Signed)
Rescheduled

## 2018-12-07 ENCOUNTER — Telehealth (INDEPENDENT_AMBULATORY_CARE_PROVIDER_SITE_OTHER): Payer: Self-pay | Admitting: *Deleted

## 2018-12-07 NOTE — Telephone Encounter (Signed)
Prescreened pt for COVID 19 for appt scheduled 12/08/2018 and pt answered NO to all questions 

## 2018-12-08 ENCOUNTER — Ambulatory Visit (INDEPENDENT_AMBULATORY_CARE_PROVIDER_SITE_OTHER): Payer: BLUE CROSS/BLUE SHIELD | Admitting: Orthopaedic Surgery

## 2018-12-08 ENCOUNTER — Ambulatory Visit (INDEPENDENT_AMBULATORY_CARE_PROVIDER_SITE_OTHER): Payer: BLUE CROSS/BLUE SHIELD

## 2018-12-08 ENCOUNTER — Other Ambulatory Visit: Payer: Self-pay

## 2018-12-08 ENCOUNTER — Ambulatory Visit (INDEPENDENT_AMBULATORY_CARE_PROVIDER_SITE_OTHER): Payer: Self-pay

## 2018-12-08 ENCOUNTER — Encounter (INDEPENDENT_AMBULATORY_CARE_PROVIDER_SITE_OTHER): Payer: Self-pay | Admitting: Orthopaedic Surgery

## 2018-12-08 VITALS — Ht 67.0 in | Wt 172.0 lb

## 2018-12-08 DIAGNOSIS — M545 Low back pain, unspecified: Secondary | ICD-10-CM | POA: Insufficient documentation

## 2018-12-08 DIAGNOSIS — M542 Cervicalgia: Secondary | ICD-10-CM | POA: Diagnosis not present

## 2018-12-08 DIAGNOSIS — Z981 Arthrodesis status: Secondary | ICD-10-CM

## 2018-12-08 NOTE — Progress Notes (Signed)
   Post-Op Visit Note   Patient: Bailey Berry           Date of Birth: Feb 11, 1961           MRN: [id number] Visit Date: 12/08/2018 PCP: Adrian Prows, PA-C   Assessment & Plan: Patient is neck doing well she just wears her collar at night since she sleeps better with it on.  She injured her back when she is reaching sideways and pulling on a bicycle that was stopped for grandchildren.  She has had some pain over the left SI joint that radiates into her buttocks.  She states she has some difficulty getting comfortable.  She took some pain medication that seemed to give her some relief as well as muscle relaxant.  She is been able to ambulate no associated bowel or bladder symptoms no chills or fever.  No falling or leg weakness.  Chief Complaint:  Chief Complaint  Patient presents with  . Neck - Follow-up    09/28/2018 C5-6, C6-7 ACDF, Allograft, Plate   Visit Diagnoses:  1. History of fusion of cervical spine   2. Acute left-sided low back pain, unspecified whether sciatica present     Plan: Continue walking and stretching for lumbar spine.  Radiographs were negative and she is neurologically intact.  Cervical spine x-rays show progressive incorporation and no motion.  We will release her from care and check her back again as needed.  She is happy with the results of the cervical fusion and good relief of her preop pain.  Follow-Up Instructions: Return if symptoms worsen or fail to improve.   Orders:  Orders Placed This Encounter  Procedures  . XR Cervical Spine 2 or 3 views  . XR Lumbar Spine 2-3 Views   No orders of the defined types were placed in this encounter.   Imaging: No results found.  PMFS History: Patient Active Problem List   Diagnosis Date Noted  . History of fusion of cervical spine 10/07/2018  . Cervical stenosis of spine 09/28/2018  . Spinal stenosis of cervical region 09/15/2018  . Personal history of calcium pyrophosphate deposition disease (CPPD)  06/23/2018  . Hyperlipidemia 06/23/2018  . Vitamin D deficiency 06/23/2018  . Bilateral carpal tunnel syndrome 10/15/2017  . Double crush syndrome 10/15/2017  . 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 Arthritis    hands  . Bell's palsy   . Cancer [facility])    breast  . Depression     No family history on file.  Past Surgical History:  Procedure Laterality Date  . ANTERIOR CERVICAL DECOMP/DISCECTOMY FUSION N/A 09/28/2018   Procedure: C5-6, C6-7 ANTERIOR CERVICAL DECOMPRESSION/DISCECTOMY ALLOGRAFT;  Surgeon: Marybelle Killings, MD;  Location: [facility];  Service: Orthopedics;  Laterality: N/A;  . APPENDECTOMY    . BREAST SURGERY Right    lumpectomy   . CERVICAL POLYPECTOMY    . COLONOSCOPY    . TUBAL LIGATION     Social History   Occupational History  . Not on file  Tobacco Use  . Smoking status: Never Smoker  . Smokeless tobacco: Never Used  Substance and Sexual Activity  . Alcohol use: Yes    Comment: rarely  . Drug use: Never  . Sexual activity: Not on file

## 2019-03-09 ENCOUNTER — Ambulatory Visit: Payer: BLUE CROSS/BLUE SHIELD | Admitting: Orthopaedic Surgery

## 2019-03-23 ENCOUNTER — Ambulatory Visit: Payer: BC Managed Care – PPO

## 2019-03-23 ENCOUNTER — Ambulatory Visit (INDEPENDENT_AMBULATORY_CARE_PROVIDER_SITE_OTHER): Payer: BC Managed Care – PPO | Admitting: Orthopaedic Surgery

## 2019-03-23 ENCOUNTER — Other Ambulatory Visit: Payer: Self-pay

## 2019-03-23 VITALS — BP 118/78 | HR 75 | Ht 67.0 in | Wt 172.0 lb

## 2019-03-23 DIAGNOSIS — M79602 Pain in left arm: Secondary | ICD-10-CM | POA: Diagnosis not present

## 2019-03-23 DIAGNOSIS — M79601 Pain in right arm: Secondary | ICD-10-CM

## 2019-03-23 MED ORDER — MELOXICAM 7.5 MG PO TABS: 7.5000 mg | ORAL_TABLET | Freq: Every day | ORAL | 1 refills | Status: DC

## 2019-03-23 NOTE — Progress Notes (Signed)
Office Visit Note   Patient: Bailey Berry           Date of Birth: [birth year]           MRN: [id number] Visit Date: 03/23/2019              Requested by: Adrian Prows, [address] [place] [address] PCP: Adrian Prows, PA-C   Assessment & Plan: Visit Diagnoses:  1. Bilateral arm pain     Plan: We will place her on some Mobic she can take daily.  I plan to recheck her in 3 weeks.  This may represent some synovitis possibly associated CPPD with multi-joint symptoms.  If she has persistent problems will consider arthritis panel on return.  We reviewed x-rays and appears her cervical fusion has consolidated well with good healing and incorporation of the graft.  No evidence of radiculopathy on exam today.  Follow-Up Instructions: Return in about 3 weeks (around 04/13/2019).   Orders:  Orders Placed This Encounter  Procedures  . XR Cervical Spine 2 or 3 views   Meds ordered this encounter  Medications  . meloxicam (MOBIC) 7.5 MG tablet    Sig: Take 1 tablet (7.5 mg total) by mouth daily.    Dispense:  30 tablet    Refill:  1      Procedures: No procedures performed   Clinical Data: No additional findings.   Subjective: Chief Complaint  Patient presents with  . Left Arm - Pain  . Right Arm - Pain  . Neck - Pain    HPI 58 year old female post C5-6 C6-7 ACDF 09/28/2018 is seen with some discomfort in her shoulders pain reaching behind her with her right shoulder as well as overhead at times and also pain in both wrists primarily over the dorsal radiocarpal joint.  She denies any specific injury she does have a history of CPPD.  History of breast cancer 10 years ago.  Previous nerve conduction velocities positive for borderline carpal tunnel syndrome.  Review of Systems 14 point system positive for right breast cancer vitamin D deficiency previous cervical fusion history low back pain, CPPD otherwise negative is obtains HPI 14 point system  update.   Objective: Vital Signs: BP 118/78   Pulse 75   Ht 5\' 7"  (1.702 m)   Wt 172 lb (78 kg)   LMP 10/23/2017   BMI 26.94 kg/m   Physical Exam Constitutional:      Appearance: She is well-developed.  HENT:     Head: Normocephalic.     Right Ear: External ear normal.     Left Ear: External ear normal.  Eyes:     Pupils: Pupils are equal, round, and reactive to light.  Neck:     Thyroid: No thyromegaly.     Trachea: No tracheal deviation.  Cardiovascular:     Rate and Rhythm: Normal rate.  Pulmonary:     Effort: Pulmonary effort is normal.  Abdominal:     Palpations: Abdomen is soft.  Skin:    General: Skin is warm and dry.  Neurological:     Mental Status: She is alert and oriented to person, place, and time.  Psychiatric:        Behavior: Behavior normal.     Ortho Exam well-healed anterior cervical incision minimal brachial plexus tenderness mild impingement right shoulder.  Negative crossarm adduction test.  Reflexes are 2+ and symmetrical.  No lower extremity clonus normal heel toe gait.Marland Kitchen  No synovitis of the elbow or wrist.  Snuffbox right and left is normal negative Finkelstein test.  Specialty Comments:  No specialty comments available.  Imaging: Xr Cervical Spine 2 Or 3 Views  Result Date: 03/23/2019 AP lateral cervical spine x-rays are obtained and reviewed this shows solid fusion at C5-6, C6-7 allograft with plate.  No adjacent level degenerative changes. Impression: Satisfactory two-level cervical fusion as described above.    PMFS History: Patient Active Problem List   Diagnosis Date Noted  . Acute left-sided low back pain 12/08/2018  . History of fusion of cervical spine 10/07/2018  . Cervical stenosis of spine 09/28/2018  . Spinal stenosis of cervical region 09/15/2018  . Personal history of calcium pyrophosphate deposition disease (CPPD) 06/23/2018  . Hyperlipidemia 06/23/2018  . Vitamin D deficiency 06/23/2018  . Bilateral carpal tunnel  syndrome 10/15/2017  . Double crush syndrome 10/15/2017  . 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 Arthritis    hands  . Bell's palsy   . Cancer [facility])    breast  . Depression     No family history on file.  Past Surgical History:  Procedure Laterality Date  . ANTERIOR CERVICAL DECOMP/DISCECTOMY FUSION N/A 09/28/2018   Procedure: C5-6, C6-7 ANTERIOR CERVICAL DECOMPRESSION/DISCECTOMY ALLOGRAFT;  Surgeon: Marybelle Killings, MD;  Location: [facility];  Service: Orthopedics;  Laterality: N/A;  . APPENDECTOMY    . BREAST SURGERY Right    lumpectomy   . CERVICAL POLYPECTOMY    . COLONOSCOPY    . TUBAL LIGATION     Social History   Occupational History  . Not on file  Tobacco Use  . Smoking status: Never Smoker  . Smokeless tobacco: Never Used  Substance and Sexual Activity  . Alcohol use: Yes    Comment: rarely  . Drug use: Never  . Sexual activity: Not on file

## 2019-04-13 ENCOUNTER — Ambulatory Visit (INDEPENDENT_AMBULATORY_CARE_PROVIDER_SITE_OTHER): Payer: BC Managed Care – PPO | Admitting: Orthopaedic Surgery

## 2019-04-13 ENCOUNTER — Encounter: Payer: Self-pay | Admitting: Orthopaedic Surgery

## 2019-04-13 VITALS — Ht 67.0 in | Wt 172.0 lb

## 2019-04-13 DIAGNOSIS — M4802 Spinal stenosis, cervical region: Secondary | ICD-10-CM

## 2019-04-13 DIAGNOSIS — Z8739 Personal history of other diseases of the musculoskeletal system and connective tissue: Secondary | ICD-10-CM

## 2019-04-13 NOTE — Addendum Note (Signed)
Addended by: Meyer Cory on: 04/13/2019 02:14 PM   Modules accepted: Orders

## 2019-04-13 NOTE — Progress Notes (Signed)
Office Visit Note   Patient: Bailey Berry           Date of Birth: 01/01/1961           MRN: [id number] Visit Date: 04/13/2019              Requested by: Adrian Prows, [place] [address] PCP: Adrian Prows, PA-C   Assessment & Plan: Visit Diagnoses:  1. Personal history of calcium pyrophosphate deposition disease (CPPD)   2. Spinal stenosis of cervical region     Plan: We will check an arthritis panel.  I discussed with her possibly having her take for 5 days or a week of prednisone.  She does not want to have any weight gain and is concerned about taking prednisone.  I will call her with the arthritis panel results and plan to recheck her in 1 month.  Follow-Up Instructions: Return in about 1 month (around 05/14/2019).   Orders:  No orders of the defined types were placed in this encounter.  No orders of the defined types were placed in this encounter.     Procedures: No procedures performed   Clinical Data: No additional findings.   Subjective: Chief Complaint  Patient presents with  . Right Arm - Pain, Follow-up  . Left Arm - Pain, Follow-up    HPI 58 year old female returns with ongoing problems with shoulder pain elbow pain and wrist pain both right and left about equal.  She does not have pain in between the joints has not noticed any significant swelling in the elbow or wrist but continues to have increased pain when she does daily activities such as cleaning her house.  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  No problems since that time.  This was in 2017.  She took Mobic which we prescribed when I saw her on 03/23/2019 but she has not noticed any improvement.  Previous nerve conduction velocity showed borderline right median nerve entrapment at the wrist.  Review of Systems reviewed updated and unchanged.  Two-level cervical fusion C5-C7  January 2020 with x-ray showing good incorporation.  Past history of breast cancer.   Objective: Vital Signs: Ht 5\' 7"  (1.702 m)   Wt 172 lb (78 kg)   LMP 10/23/2017   BMI 26.94 kg/m   Physical Exam Constitutional:      Appearance: She is well-developed.  HENT:     Head: Normocephalic.     Right Ear: External ear normal.     Left Ear: External ear normal.  Eyes:     Pupils: Pupils are equal, round, and reactive to light.  Neck:     Thyroid: No thyromegaly.     Trachea: No tracheal deviation.  Cardiovascular:     Rate and Rhythm: Normal rate.  Pulmonary:     Effort: Pulmonary effort is normal.  Abdominal:     Palpations: Abdomen is soft.  Skin:    General: Skin is warm and dry.  Neurological:     Mental Status: She is alert and oriented to person, place, and time.  Psychiatric:        Behavior: Behavior normal.     Ortho Exam patient has some tenderness over wrist both dorsal and volar surface.  No wrist effusion is noted.  Dorsal compartments are normal.  Elbow reaches full extension.  No elbow effusion is noted right or left.  Well-healed cervical  incision left side of her neck.  Normal gait.  Specialty Comments:  No specialty comments available.  Imaging: No results found.   PMFS History: Patient Active Problem List   Diagnosis Date Noted  . Acute left-sided low back pain 12/08/2018  . History of fusion of cervical spine 10/07/2018  . Cervical stenosis of spine 09/28/2018  . Spinal stenosis of cervical region 09/15/2018  . Personal history of calcium pyrophosphate deposition disease (CPPD) 06/23/2018  . Hyperlipidemia 06/23/2018  . Vitamin D deficiency 06/23/2018  . Bilateral carpal tunnel syndrome 10/15/2017  . Double crush syndrome 10/15/2017  . 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 Arthritis    hands  . Bell's palsy   . Cancer [facility])    breast  . Depression     No family history on  file.  Past Surgical History:  Procedure Laterality Date  . ANTERIOR CERVICAL DECOMP/DISCECTOMY FUSION N/A 09/28/2018   Procedure: C5-6, C6-7 ANTERIOR CERVICAL DECOMPRESSION/DISCECTOMY ALLOGRAFT;  Surgeon: Marybelle Killings, MD;  Location: [facility];  Service: Orthopedics;  Laterality: N/A;  . APPENDECTOMY    . BREAST SURGERY Right    lumpectomy   . CERVICAL POLYPECTOMY    . COLONOSCOPY    . TUBAL LIGATION     Social History   Occupational History  . Not on file  Tobacco Use  . Smoking status: Never Smoker  . Smokeless tobacco: Never Used  Substance and Sexual Activity  . Alcohol use: Yes    Comment: rarely  . Drug use: Never  . Sexual activity: Not on file

## 2019-04-14 ENCOUNTER — Other Ambulatory Visit: Payer: Self-pay | Admitting: Orthopaedic Surgery

## 2019-04-14 LAB — ANA: Anti Nuclear Antibody (ANA): NEGATIVE

## 2019-04-14 LAB — SEDIMENTATION RATE: Sed Rate: 6 mm/h (ref 0–30)

## 2019-04-14 LAB — RHEUMATOID FACTOR: Rheumatoid fact SerPl-aCnc: <14 IU/mL (ref <14)

## 2019-04-14 LAB — URIC ACID: Uric Acid, Serum: 4.2 mg/dL (ref 2.5–7.0)

## 2019-04-14 MED ORDER — PREDNISONE 10 MG PO TABS: 10.0000 mg | ORAL_TABLET | Freq: Every day | ORAL | 0 refills | Status: AC

## 2019-05-15 ENCOUNTER — Other Ambulatory Visit: Payer: Self-pay | Admitting: Orthopaedic Surgery

## 2019-05-18 NOTE — Telephone Encounter (Signed)
Please advise 

## 2019-06-04 ENCOUNTER — Other Ambulatory Visit: Payer: Self-pay

## 2019-06-04 ENCOUNTER — Ambulatory Visit: Payer: Self-pay

## 2019-06-04 ENCOUNTER — Ambulatory Visit (INDEPENDENT_AMBULATORY_CARE_PROVIDER_SITE_OTHER): Payer: BC Managed Care – PPO | Admitting: Orthopedic Surgery

## 2019-06-04 ENCOUNTER — Encounter: Payer: Self-pay | Admitting: Orthopedic Surgery

## 2019-06-04 DIAGNOSIS — M25532 Pain in left wrist: Secondary | ICD-10-CM | POA: Diagnosis not present

## 2019-06-04 DIAGNOSIS — M25531 Pain in right wrist: Secondary | ICD-10-CM | POA: Diagnosis not present

## 2019-06-05 NOTE — Progress Notes (Signed)
Office Visit Note   Patient: Bailey Berry           Date of Birth: 12/10/1960           MRN: [id number] Visit Date: 06/04/2019 Requested by: Adrian Prows, [place] [address] PCP: Mila Homer  Subjective: Chief Complaint  Patient presents with  . Arm Pain    HPI: Bailey Berry is a 58 y.o. female who presents to the office complaining of bilateral arm pain.   Patient complains of bilateral arm pain for 4 months that she localizes to the bilateral wrist, elbow, shoulder.  Patient notes pain begins in her wrists and radiates up to her elbows on occasion.  Her shoulder pain does not radiate.  Her right arm causes her more pain.  She denies any history of injury.  Denies any pain in the hand or joints of the fingers.  Denies any weakness.  She has a history of a 2 level ACDF by Dr. Inda Merlin in January 2020.  He has been following her and ordered a panel of blood work including rheumatoid factor, ANA, ESR, uric acid which were all normal.  Patient was briefly on a course of oral prednisone which provided her a couple weeks of relief.  She denies any neck pain or radicular neck symptoms.  Denies any numbness or tingling.              ROS:  All systems reviewed are negative as they relate to the chief complaint within the history of present illness.  Patient denies fevers or chills.  Assessment & Plan: Visit Diagnoses:  1. Pain in left wrist   2. Pain in right wrist     Plan: Patient is a 58 year old female with history of previous neck surgery who presents with bilateral upper extremity pain.  I do not think her symptoms are coming from her neck.  Patient has a multitude of complaints and joints that are causing her pain.  Impression is polymyalgia rheumatica due to the number of joints involved and the fact that her pain improved with a course of oral steroids.  Will refer to Dr. Estanislado Pandy for rheumatologic work-up.  Patient agreed with  the plan.  Follow-Up Instructions: No follow-ups on file.   Orders:  Orders Placed This Encounter  Procedures  . XR Wrist 2 Views Right  . XR Wrist 2 Views Left  . Ambulatory referral to Rheumatology   No orders of the defined types were placed in this encounter.     Procedures: No procedures performed   Clinical Data: No additional findings.  Objective: Vital Signs: LMP 10/23/2017   Physical Exam:  Constitutional: Patient appears well-developed HEENT:  Head: Normocephalic Eyes:EOM are normal Neck: Normal range of motion Cardiovascular: Normal rate Pulmonary/chest: Effort normal Neurologic: Patient is alert Skin: Skin is warm Psychiatric: Patient has normal mood and affect  Ortho Exam:  Left upper extremity exam Able to fully forward flex and abduct shoulder overhead No loss of ER relative to the other shoulder.  Good endpoint with ER No TTP over the [facility] joint or bicipital groove 5/5 motor strength of the subscapularis, supraspinatus, and infraspinatus muscles Positive Hawkins impingement 5/5 grip strength, forearm pronation/supination, and bicep strength.  Pain elicited with grip strength testing.  Tenderness to palpation over the lateral epicondyle of the left elbow No tenderness over PIN or medial epicondyle Pain elicited with resisted wrist extension. Tenderness over the first compartment of the  left wrist Positive Finkelstein Good range of motion of the left wrist Bogginess of the left wrist present No tenderness over the ulnar aspect of the left wrist No swelling of the MCP joints of the left hand.  Good range of motion of the fingers.  No triggering of the left fingers.  Right upper extremity exam Able to fully forward flex and abduct shoulder overhead No loss of ER relative to the other shoulder.  Good endpoint with ER No TTP over the [facility] joint or bicipital groove Good subscapularis, supraspinatus, and infraspinatus strength Positive Hawkins  impingement 5/5 grip strength, forearm pronation/supination, and bicep strength  Tenderness to palpation over the lateral epicondyle of the right elbow No tenderness over PIN or medial epicondyle Pain elicited with resisted wrist extension. No tenderness over the first compartment of the right wrist Negative Finkelstein Mild pain with thumb circumduction Good range of motion of the right wrist Bogginess of the right wrist present No tenderness over the ulnar aspect of the right wrist No swelling of the MCP joints of the right hand.  Good range of motion of the fingers.  No triggering of the right fingers.   Specialty Comments:  No specialty comments available.  Imaging: No results found.   PMFS History: Patient Active Problem List   Diagnosis Date Noted  . Acute left-sided low back pain 12/08/2018  . History of fusion of cervical spine 10/07/2018  . Cervical stenosis of spine 09/28/2018  . Spinal stenosis of cervical region 09/15/2018  . Personal history of calcium pyrophosphate deposition disease (CPPD) 06/23/2018  . Hyperlipidemia 06/23/2018  . Vitamin D deficiency 06/23/2018  . Bilateral carpal tunnel syndrome 10/15/2017  . Double crush syndrome 10/15/2017  . 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 Arthritis    hands  . Bell's palsy   . Cancer [facility])    breast  . Depression     History reviewed. No pertinent family history.  Past Surgical History:  Procedure Laterality Date  . ANTERIOR CERVICAL DECOMP/DISCECTOMY FUSION N/A 09/28/2018   Procedure: C5-6, C6-7 ANTERIOR CERVICAL DECOMPRESSION/DISCECTOMY ALLOGRAFT;  Surgeon: Marybelle Killings, MD;  Location: [facility];  Service: Orthopedics;  Laterality: N/A;  . APPENDECTOMY    . BREAST SURGERY Right    lumpectomy   . CERVICAL POLYPECTOMY    . COLONOSCOPY    . TUBAL LIGATION     Social History   Occupational History  . Not on file  Tobacco Use  . Smoking  status: Never Smoker  . Smokeless tobacco: Never Used  Substance and Sexual Activity  . Alcohol use: Yes    Comment: rarely  . Drug use: Never  . Sexual activity: Not on file

## 2019-06-11 NOTE — Progress Notes (Signed)
Office Visit Note  Patient: Bailey Berry             Date of Birth: 05/29/1961           MRN: [id number]             PCP: Adrian Prows, PA-C Referring: Meredith Pel, MD Visit Date: 06/25/2019 Occupation: Self -employed, [employer]  Subjective:  Pain in multiple joints.   History of Present Illness: Bailey Berry is a 58 y.o. female seen in consultation per request of Dr. Marlou Sa.  According to patient she had C-spine fusion in January 2020 for spinal stenosis.  She states since then she has been experiencing pain in her shoulders, bilateral elbows and her wrist joints.  She also had a nerve conduction velocity done which showed mild right carpal tunnel syndrome.  She states the carpal tunnel syndrome is not bothering her currently.  She has noticed increased symptoms in her right hand with decreased grip strength.  She states sometimes while she is on the phone her hands get stuck and she has to massage her hand to open it.  She denies any joint swelling.  She denies any discomfort in her lower extremities.  Activities of Daily Living:  Patient reports morning stiffness for 5 minutes.   Patient Reports nocturnal pain.  Difficulty dressing/grooming: Denies Difficulty climbing stairs: Denies Difficulty getting out of chair: Reports Difficulty using hands for taps, buttons, cutlery, and/or writing: Reports  Review of Systems  Constitutional: Negative for fatigue, night sweats, weight gain and weight loss.  HENT: Negative for mouth sores, trouble swallowing, trouble swallowing, mouth dryness and nose dryness.   Eyes: Negative for pain, redness, itching, visual disturbance and dryness.  Respiratory: Negative for cough, shortness of breath, wheezing and difficulty breathing.   Cardiovascular: Negative for chest pain, palpitations, hypertension, irregular heartbeat and swelling in legs/feet.  Gastrointestinal: Positive for constipation. Negative for blood in stool and diarrhea.   Endocrine: Negative for increased urination.  Genitourinary: Negative for difficulty urinating, painful urination and vaginal dryness.  Musculoskeletal: Positive for arthralgias, joint pain and morning stiffness. Negative for joint swelling, myalgias, muscle weakness, muscle tenderness and myalgias.  Skin: Negative for color change, rash, hair loss, skin tightness, ulcers and sensitivity to sunlight.  Allergic/Immunologic: Negative for susceptible to infections.  Neurological: Negative for dizziness, light-headedness, numbness, headaches, memory loss, night sweats and weakness.  Hematological: Negative for bruising/bleeding tendency and swollen glands.  Psychiatric/Behavioral: Negative for depressed mood, confusion and sleep disturbance. The patient is not nervous/anxious.     PMFS History:  Patient Active Problem List   Diagnosis Date Noted  . Acute left-sided low back pain 12/08/2018  . History of fusion of cervical spine 10/07/2018  . Cervical stenosis of spine 09/28/2018  . Spinal stenosis of cervical region 09/15/2018  . Personal history of calcium pyrophosphate deposition disease (CPPD) 06/23/2018  . Hyperlipidemia 06/23/2018  . Vitamin D deficiency 06/23/2018  . Bilateral carpal tunnel syndrome 10/15/2017  . Double crush syndrome 10/15/2017  . Ductal carcinoma in situ (DCIS) of right breast 12/06/2014    Past Medical History:  Diagnosis Date  . Arthritis    hands  . Bell's palsy   . Cancer [facility])    breast  . Depression     Family History  Problem Relation Age of Onset  . Lung cancer Father   . Healthy Sister   . Healthy Brother   . Healthy Brother   . Healthy Brother   . Healthy Sister   .  Healthy Son   . Healthy Son   . Healthy Daughter   . Healthy Daughter    Past Surgical History:  Procedure Laterality Date  . ANTERIOR CERVICAL DECOMP/DISCECTOMY FUSION N/A 09/28/2018   Procedure: C5-6, C6-7 ANTERIOR CERVICAL DECOMPRESSION/DISCECTOMY ALLOGRAFT;  Surgeon:  Marybelle Killings, MD;  Location: [facility];  Service: Orthopedics;  Laterality: N/A;  . APPENDECTOMY    . BREAST SURGERY Right    lumpectomy   . CERVICAL POLYPECTOMY    . COLONOSCOPY    . TUBAL LIGATION     Social History   Social History Narrative  . Not on file    There is no immunization history on file for this patient.   Objective: Vital Signs: BP 121/80 (BP Location: Right Arm, Patient Position: Sitting, Cuff Size: Normal)   Pulse 74   Resp 14   Ht 5\' 7"  (1.702 m)   Wt 166 lb 9.6 oz (75.6 kg)   LMP 10/23/2017   BMI 26.09 kg/m    Physical Exam Vitals signs and nursing note reviewed.  Constitutional:      Appearance: She is well-developed.  HENT:     Head: Normocephalic and atraumatic.  Eyes:     Conjunctiva/sclera: Conjunctivae normal.  Neck:     Musculoskeletal: Normal range of motion.  Cardiovascular:     Rate and Rhythm: Normal rate and regular rhythm.     Heart sounds: Normal heart sounds.  Pulmonary:     Effort: Pulmonary effort is normal.     Breath sounds: Normal breath sounds.  Abdominal:     General: Bowel sounds are normal.     Palpations: Abdomen is soft.  Lymphadenopathy:     Cervical: No cervical adenopathy.  Skin:    General: Skin is warm and dry.     Capillary Refill: Capillary refill takes less than 2 seconds.  Neurological:     Mental Status: She is alert and oriented to person, place, and time.  Psychiatric:        Behavior: Behavior normal.      Musculoskeletal Exam: She has some limitation of range of motion of her cervical spine without discomfort.  She had good range of motion of her lumbar spine with no SI joint tenderness.  She has discomfort range of motion of bilateral shoulder joints.  No synovitis was noted over elbow joints.  She has mild tenderness of her wrist joints but no synovitis was noted.  She has some thickening of her right second MCP joint without any synovitis.  She has prominence of PIP and DIP joints with no synovitis.   She has good range of motion of her hip joints, knee joints, ankles and MTPs.  CDAI Exam: CDAI Score: - Patient Global: -; Provider Global: - Swollen: -; Tender: - Joint Exam   No joint exam has been documented for this visit   There is currently no information documented on the homunculus. Go to the Rheumatology activity and complete the homunculus joint exam.  Investigation: Findings:  04/13/19: ANA negative, RF<14, sed rate 6, uric acid 4.2  Component     Latest Ref Rng & Units 04/13/2019  Uric Acid, Serum     2.5 - 7.0 mg/dL 4.2  Sed Rate     0 - 30 mm/h 6  Anti Nuclear Antibody (ANA)     NEGATIVE NEGATIVE  RA Latex Turbid.     <14 IU/mL <14   Imaging: Xr Hand 2 View Left  Result Date: 06/25/2019 No MCP, PIP  or DIP narrowing was noted.  No CMC, intercarpal, radiocarpal joint space narrowing was noted.  No erosive changes were noted.  No chondrocalcinosis was noted. Impression: Unremarkable x-ray of the hand.  Xr Hand 2 View Right  Result Date: 06/25/2019 No MCP, PIP or DIP narrowing was noted.  No CMC, intercarpal, radiocarpal joint space narrowing was noted.  No erosive changes were noted.  No chondrocalcinosis was noted. Impression: Unremarkable x-ray of the hand.  Xr Shoulder Left  Result Date: 06/25/2019 No glenohumeral joint space narrowing was noted.  No acromioclavicular joint space narrowing was noted.  No chondrocalcinosis was noted. Impression: Unremarkable x-ray of the shoulder joint.  Xr Shoulder Right  Result Date: 06/25/2019 No glenohumeral joint space narrowing was noted.  No acromioclavicular joint space narrowing was noted.  No chondrocalcinosis was noted. Impression: Unremarkable x-ray of the shoulder joint.   Recent Labs: No results found for: WBC, HGB, PLT, NA, K, CL, CO2, GLUCOSE, BUN, CREATININE, BILITOT, ALKPHOS, AST, ALT, PROT, ALBUMIN, CALCIUM, GFRAA, QFTBGOLD, QFTBGOLDPLUS  Speciality Comments: No specialty comments available.   Procedures:  No procedures performed Allergies: Patient has no known allergies.   Assessment / Plan:     Visit Diagnoses: Pain in both hands -patient complains of pain in her bilateral hands and her wrist joints.  She states her hands lock up on her at times.  She has decreased grip strength.  She had evaluation by Dr. Marlou Sa and had x-rays of her wrist joints which were unremarkable.  She also had nerve conduction velocity test which showed mild right carpal tunnel syndrome.  I do not see any synovitis on examination today.  So far all the autoimmune work-up has been negative.  I would obtain following labs and x-rays today.  Plan: XR Hand 2 View Right, XR Hand 2 View Left, x-ray of bilateral hands were unremarkable.  Cyclic citrul peptide antibody, IgG, 14-3-3 eta Protein, Iron, TIBC and Ferritin Panel, Magnesium, Angiotensin converting enzyme.  Have given her a handout on hand exercises.  I will schedule ultrasound to rule out synovitis.  Pain of both elbows-no synovitis was noted.  Chronic pain of both shoulders -she had painful range of motion.  Plan: XR Shoulder Left, XR Shoulder Right.  X-ray of bilateral shoulders are unremarkable.  A handout on shoulder joint exercises was given.  Personal history of calcium pyrophosphate deposition disease (CPPD)-patient states that chondrocalcinosis was noted in her knee joints in the past.  I do not have those x-rays to review.  Patient have a prescription of Mobic but she does not like to take it on regular basis.  Carpal tunnel syndrome of right wrist - mild on NCV  Spinal stenosis of cervical region  History of fusion of cervical spine-she had fusion by Dr. Lorin Mercy in January 2020.  Ductal carcinoma in situ (DCIS) of right breast  Vitamin D deficiency  History of hyperlipidemia  Orders: Orders Placed This Encounter  Procedures  . XR Hand 2 View Right  . XR Hand 2 View Left  . XR Shoulder Left  . XR Shoulder Right  . Cyclic citrul peptide  antibody, IgG  . 14-3-3 eta Protein  . Iron, TIBC and Ferritin Panel  . Magnesium  . Angiotensin converting enzyme   No orders of the defined types were placed in this encounter.   Face-to-face time spent with patient was 45 minutes. Greater than 50% of time was spent in counseling and coordination of care.  Follow-Up Instructions: No follow-ups on file.  Bo Merino, MD  Note - This record has been created using Editor, commissioning.  Chart creation errors have been sought, but may not always  have been located. Such creation errors do not reflect on  the standard of medical care.

## 2019-06-25 ENCOUNTER — Encounter: Payer: Self-pay | Admitting: Rheumatology

## 2019-06-25 ENCOUNTER — Ambulatory Visit: Payer: Self-pay

## 2019-06-25 ENCOUNTER — Ambulatory Visit: Payer: BC Managed Care – PPO | Admitting: Rheumatology

## 2019-06-25 ENCOUNTER — Other Ambulatory Visit: Payer: Self-pay

## 2019-06-25 VITALS — BP 121/80 | HR 74 | Resp 14 | Ht 67.0 in | Wt 166.6 lb

## 2019-06-25 DIAGNOSIS — M25521 Pain in right elbow: Secondary | ICD-10-CM

## 2019-06-25 DIAGNOSIS — M25522 Pain in left elbow: Secondary | ICD-10-CM

## 2019-06-25 DIAGNOSIS — M79641 Pain in right hand: Secondary | ICD-10-CM | POA: Diagnosis not present

## 2019-06-25 DIAGNOSIS — G5601 Carpal tunnel syndrome, right upper limb: Secondary | ICD-10-CM

## 2019-06-25 DIAGNOSIS — M79642 Pain in left hand: Secondary | ICD-10-CM | POA: Diagnosis not present

## 2019-06-25 DIAGNOSIS — G8929 Other chronic pain: Secondary | ICD-10-CM

## 2019-06-25 DIAGNOSIS — M25512 Pain in left shoulder: Secondary | ICD-10-CM

## 2019-06-25 DIAGNOSIS — Z981 Arthrodesis status: Secondary | ICD-10-CM

## 2019-06-25 DIAGNOSIS — Z8739 Personal history of other diseases of the musculoskeletal system and connective tissue: Secondary | ICD-10-CM | POA: Diagnosis not present

## 2019-06-25 DIAGNOSIS — M25511 Pain in right shoulder: Secondary | ICD-10-CM

## 2019-06-25 DIAGNOSIS — D0511 Intraductal carcinoma in situ of right breast: Secondary | ICD-10-CM

## 2019-06-25 DIAGNOSIS — E559 Vitamin D deficiency, unspecified: Secondary | ICD-10-CM

## 2019-06-25 DIAGNOSIS — M4802 Spinal stenosis, cervical region: Secondary | ICD-10-CM

## 2019-06-25 DIAGNOSIS — Z8639 Personal history of other endocrine, nutritional and metabolic disease: Secondary | ICD-10-CM

## 2019-06-25 NOTE — Patient Instructions (Signed)
Shoulder Exercises Ask your health care provider which exercises are safe for you. Do exercises exactly as told by your health care provider and adjust them as directed. It is normal to feel mild stretching, pulling, tightness, or discomfort as you do these exercises. Stop right away if you feel sudden pain or your pain gets worse. Do not begin these exercises until told by your health care provider. Stretching exercises External rotation and abduction This exercise is sometimes called corner stretch. This exercise rotates your arm outward (external rotation) and moves your arm out from your body (abduction). 1. Stand in a doorway with one of your feet slightly in front of the other. This is called a staggered stance. If you cannot reach your forearms to the door frame, stand facing a corner of a room. 2. Choose one of the following positions as told by your health care provider: ? Place your hands and forearms on the door frame above your head. ? Place your hands and forearms on the door frame at the height of your head. ? Place your hands on the door frame at the height of your elbows. 3. Slowly move your weight onto your front foot until you feel a stretch across your chest and in the front of your shoulders. Keep your head and chest upright and keep your abdominal muscles tight. 4. Hold for __________ seconds. 5. To release the stretch, shift your weight to your back foot. Repeat __________ times. Complete this exercise __________ times a day. Extension, standing 1. Stand and hold a broomstick, a cane, or a similar object behind your back. ? Your hands should be a little wider than shoulder width apart. ? Your palms should face away from your back. 2. Keeping your elbows straight and your shoulder muscles relaxed, move the stick away from your body until you feel a stretch in your shoulders (extension). ? Avoid shrugging your shoulders while you move the stick. Keep your shoulder blades tucked  down toward the middle of your back. 3. Hold for __________ seconds. 4. Slowly return to the starting position. Repeat __________ times. Complete this exercise __________ times a day. Range-of-motion exercises Pendulum  1. Stand near a wall or a surface that you can hold onto for balance. 2. Bend at the waist and let your left / right arm hang straight down. Use your other arm to support you. Keep your back straight and do not lock your knees. 3. Relax your left / right arm and shoulder muscles, and move your hips and your trunk so your left / right arm swings freely. Your arm should swing because of the motion of your body, not because you are using your arm or shoulder muscles. 4. Keep moving your hips and trunk so your arm swings in the following directions, as told by your health care provider: ? Side to side. ? Forward and backward. ? In clockwise and counterclockwise circles. 5. Continue each motion for __________ seconds, or for as long as told by your health care provider. 6. Slowly return to the starting position. Repeat __________ times. Complete this exercise __________ times a day. Shoulder flexion, standing  1. Stand and hold a broomstick, a cane, or a similar object. Place your hands a little more than shoulder width apart on the object. Your left / right hand should be palm up, and your other hand should be palm down. 2. Keep your elbow straight and your shoulder muscles relaxed. Push the stick up with your healthy arm to   raise your left / right arm in front of your body, and then over your head until you feel a stretch in your shoulder (flexion). ? Avoid shrugging your shoulder while you raise your arm. Keep your shoulder blade tucked down toward the middle of your back. 3. Hold for __________ seconds. 4. Slowly return to the starting position. Repeat __________ times. Complete this exercise __________ times a day. Shoulder abduction, standing 1. Stand and hold a broomstick,  a cane, or a similar object. Place your hands a little more than shoulder width apart on the object. Your left / right hand should be palm up, and your other hand should be palm down. 2. Keep your elbow straight and your shoulder muscles relaxed. Push the object across your body toward your left / right side. Raise your left / right arm to the side of your body (abduction) until you feel a stretch in your shoulder. ? Do not raise your arm above shoulder height unless your health care provider tells you to do that. ? If directed, raise your arm over your head. ? Avoid shrugging your shoulder while you raise your arm. Keep your shoulder blade tucked down toward the middle of your back. 3. Hold for __________ seconds. 4. Slowly return to the starting position. Repeat __________ times. Complete this exercise __________ times a day. Internal rotation  1. Place your left / right hand behind your back, palm up. 2. Use your other hand to dangle an exercise band, a towel, or a similar object over your shoulder. Grasp the band with your left / right hand so you are holding on to both ends. 3. Gently pull up on the band until you feel a stretch in the front of your left / right shoulder. The movement of your arm toward the center of your body is called internal rotation. ? Avoid shrugging your shoulder while you raise your arm. Keep your shoulder blade tucked down toward the middle of your back. 4. Hold for __________ seconds. 5. Release the stretch by letting go of the band and lowering your hands. Repeat __________ times. Complete this exercise __________ times a day. Strengthening exercises External rotation  1. Sit in a stable chair without armrests. 2. Secure an exercise band to a stable object at elbow height on your left / right side. 3. Place a soft object, such as a folded towel or a small pillow, between your left / right upper arm and your body to move your elbow about 4 inches (10 cm) away  from your side. 4. Hold the end of the exercise band so it is tight and there is no slack. 5. Keeping your elbow pressed against the soft object, slowly move your forearm out, away from your abdomen (external rotation). Keep your body steady so only your forearm moves. 6. Hold for __________ seconds. 7. Slowly return to the starting position. Repeat __________ times. Complete this exercise __________ times a day. Shoulder abduction  1. Sit in a stable chair without armrests, or stand up. 2. Hold a __________ weight in your left / right hand, or hold an exercise band with both hands. 3. Start with your arms straight down and your left / right palm facing in, toward your body. 4. Slowly lift your left / right hand out to your side (abduction). Do not lift your hand above shoulder height unless your health care provider tells you that this is safe. ? Keep your arms straight. ? Avoid shrugging your shoulder while you   do this movement. Keep your shoulder blade tucked down toward the middle of your back. 5. Hold for __________ seconds. 6. Slowly lower your arm, and return to the starting position. Repeat __________ times. Complete this exercise __________ times a day. Shoulder extension 1. Sit in a stable chair without armrests, or stand up. 2. Secure an exercise band to a stable object in front of you so it is at shoulder height. 3. Hold one end of the exercise band in each hand. Your palms should face each other. 4. Straighten your elbows and lift your hands up to shoulder height. 5. Step back, away from the secured end of the exercise band, until the band is tight and there is no slack. 6. Squeeze your shoulder blades together as you pull your hands down to the sides of your thighs (extension). Stop when your hands are straight down by your sides. Do not let your hands go behind your body. 7. Hold for __________ seconds. 8. Slowly return to the starting position. Repeat __________ times.  Complete this exercise __________ times a day. Shoulder row 1. Sit in a stable chair without armrests, or stand up. 2. Secure an exercise band to a stable object in front of you so it is at waist height. 3. Hold one end of the exercise band in each hand. Position your palms so that your thumbs are facing the ceiling (neutral position). 4. Bend each of your elbows to a 90-degree angle (right angle) and keep your upper arms at your sides. 5. Step back until the band is tight and there is no slack. 6. Slowly pull your elbows back behind you. 7. Hold for __________ seconds. 8. Slowly return to the starting position. Repeat __________ times. Complete this exercise __________ times a day. Shoulder press-ups  1. Sit in a stable chair that has armrests. Sit upright, with your feet flat on the floor. 2. Put your hands on the armrests so your elbows are bent and your fingers are pointing forward. Your hands should be about even with the sides of your body. 3. Push down on the armrests and use your arms to lift yourself off the chair. Straighten your elbows and lift yourself up as much as you comfortably can. ? Move your shoulder blades down, and avoid letting your shoulders move up toward your ears. ? Keep your feet on the ground. As you get stronger, your feet should support less of your body weight as you lift yourself up. 4. Hold for __________ seconds. 5. Slowly lower yourself back into the chair. Repeat __________ times. Complete this exercise __________ times a day. Wall push-ups  1. Stand so you are facing a stable wall. Your feet should be about one arm-length away from the wall. 2. Lean forward and place your palms on the wall at shoulder height. 3. Keep your feet flat on the floor as you bend your elbows and lean forward toward the wall. 4. Hold for __________ seconds. 5. Straighten your elbows to push yourself back to the starting position. Repeat __________ times. Complete this exercise  __________ times a day. This information is not intended to replace advice given to you by your health care provider. Make sure you discuss any questions you have with your health care provider. Document Released: 07/10/2005 Document Revised: 12/18/2018 Document Reviewed: 09/25/2018 [employer] Patient Education  [employer]. Hand Exercises Hand exercises can be helpful for almost anyone. These exercises can strengthen the hands, improve flexibility and movement, and increase blood flow  to the hands. These results can make work and daily tasks easier. Hand exercises can be especially helpful for people who have joint pain from arthritis or have nerve damage from overuse (carpal tunnel syndrome). These exercises can also help people who have injured a hand. Exercises Most of these hand exercises are gentle stretching and motion exercises. It is usually safe to do them often throughout the day. Warming up your hands before exercise may help to reduce stiffness. You can do this with gentle massage or by placing your hands in warm water for 10-15 minutes. It is normal to feel some stretching, pulling, tightness, or mild discomfort as you begin new exercises. This will gradually improve. Stop an exercise right away if you feel sudden, severe pain or your pain gets worse. Ask your health care provider which exercises are best for you. Knuckle bend or "claw" fist 1. Stand or sit with your arm, hand, and all five fingers pointed straight up. Make sure to keep your wrist straight during the exercise. 2. Gently bend your fingers down toward your palm until the tips of your fingers are touching the top of your palm. Keep your big knuckle straight and just bend the small knuckles in your fingers. 3. Hold this position for __________ seconds. 4. Straighten (extend) your fingers back to the starting position. Repeat this exercise 5-10 times with each hand. Full finger fist 1. Stand or sit with your arm, hand,  and all five fingers pointed straight up. Make sure to keep your wrist straight during the exercise. 2. Gently bend your fingers into your palm until the tips of your fingers are touching the middle of your palm. 3. Hold this position for __________ seconds. 4. Extend your fingers back to the starting position, stretching every joint fully. Repeat this exercise 5-10 times with each hand. Straight fist 1. Stand or sit with your arm, hand, and all five fingers pointed straight up. Make sure to keep your wrist straight during the exercise. 2. Gently bend your fingers at the big knuckle, where your fingers meet your hand, and the middle knuckle. Keep the knuckle at the tips of your fingers straight and try to touch the bottom of your palm. 3. Hold this position for __________ seconds. 4. Extend your fingers back to the starting position, stretching every joint fully. Repeat this exercise 5-10 times with each hand. Tabletop 1. Stand or sit with your arm, hand, and all five fingers pointed straight up. Make sure to keep your wrist straight during the exercise. 2. Gently bend your fingers at the big knuckle, where your fingers meet your hand, as far down as you can while keeping the small knuckles in your fingers straight. Think of forming a tabletop with your fingers. 3. Hold this position for __________ seconds. 4. Extend your fingers back to the starting position, stretching every joint fully. Repeat this exercise 5-10 times with each hand. Finger spread 1. Place your hand flat on a table with your palm facing down. Make sure your wrist stays straight as you do this exercise. 2. Spread your fingers and thumb apart from each other as far as you can until you feel a gentle stretch. Hold this position for __________ seconds. 3. Bring your fingers and thumb tight together again. Hold this position for __________ seconds. Repeat this exercise 5-10 times with each hand. Making circles 1. Stand or sit  with your arm, hand, and all five fingers pointed straight up. Make sure to keep your wrist  straight during the exercise. 2. Make a circle by touching the tip of your thumb to the tip of your index finger. 3. Hold for __________ seconds. Then open your hand wide. 4. Repeat this motion with your thumb and each finger on your hand. Repeat this exercise 5-10 times with each hand. Thumb motion 1. Sit with your forearm resting on a table and your wrist straight. Your thumb should be facing up toward the ceiling. Keep your fingers relaxed as you move your thumb. 2. Lift your thumb up as high as you can toward the ceiling. Hold for __________ seconds. 3. Bend your thumb across your palm as far as you can, reaching the tip of your thumb for the small finger (pinkie) side of your palm. Hold for __________ seconds. Repeat this exercise 5-10 times with each hand. Grip strengthening  1. Hold a stress ball or other soft ball in the middle of your hand. 2. Slowly increase the pressure, squeezing the ball as much as you can without causing pain. Think of bringing the tips of your fingers into the middle of your palm. All of your finger joints should bend when doing this exercise. 3. Hold your squeeze for __________ seconds, then relax. Repeat this exercise 5-10 times with each hand. Contact a health care provider if:  Your hand pain or discomfort gets much worse when you do an exercise.  Your hand pain or discomfort does not improve within 2 hours after you exercise. If you have any of these problems, stop doing these exercises right away. Do not do them again unless your health care provider says that you can. Get help right away if:  You develop sudden, severe hand pain or swelling. If this happens, stop doing these exercises right away. Do not do them again unless your health care provider says that you can. This information is not intended to replace advice given to you by your health care provider.  Make sure you discuss any questions you have with your health care provider. Document Released: 08/07/2015 Document Revised: 12/17/2018 Document Reviewed: 08/27/2018 [employer] Patient Education  2020 [employer].

## 2019-07-01 ENCOUNTER — Other Ambulatory Visit: Payer: Self-pay | Admitting: Surgical Oncology

## 2019-07-01 DIAGNOSIS — Z1211 Encounter for screening for malignant neoplasm of colon: Secondary | ICD-10-CM

## 2019-07-07 ENCOUNTER — Ambulatory Visit (INDEPENDENT_AMBULATORY_CARE_PROVIDER_SITE_OTHER): Payer: BC Managed Care – PPO | Admitting: Rheumatology

## 2019-07-07 ENCOUNTER — Ambulatory Visit: Payer: Self-pay

## 2019-07-07 ENCOUNTER — Other Ambulatory Visit: Payer: Self-pay

## 2019-07-07 DIAGNOSIS — M79641 Pain in right hand: Secondary | ICD-10-CM | POA: Diagnosis not present

## 2019-07-07 DIAGNOSIS — M79642 Pain in left hand: Secondary | ICD-10-CM | POA: Diagnosis not present

## 2019-07-07 NOTE — Progress Notes (Signed)
Office Visit Note  Patient: Bailey Berry             Date of Birth: March 16, 1961           MRN: [id number]             PCP: Adrian Prows, PA-C Referring: Adrian Prows, PA-C Visit Date: 07/12/2019 Occupation: @GUAROCC @  Subjective:  Follow-up (New patient follow up appt, bil hand locking)   History of Present Illness: Bailey Berry is a 58 y.o. female with history of arthralgias.  She states she continues to have stiffness in her hands.  Her hands get stuck especially in the morning.  She complains of discomfort in the bilateral wrist joints. she has some discomfort in her shoulders on her knees.  She denies any joint swelling.  Activities of Daily Living:  Patient reports morning stiffness for 0 none.   Patient Denies nocturnal pain.  Difficulty dressing/grooming: Denies Difficulty climbing stairs: Denies Difficulty getting out of chair: Reports Difficulty using hands for taps, buttons, cutlery, and/or writing: Reports  Review of Systems  Constitutional: Negative for fatigue, night sweats, weight gain and weight loss.  HENT: Negative for mouth sores, trouble swallowing, trouble swallowing, mouth dryness and nose dryness.   Eyes: Negative for pain, redness, visual disturbance and dryness.  Respiratory: Negative for cough, shortness of breath and difficulty breathing.   Cardiovascular: Negative for chest pain, palpitations, hypertension, irregular heartbeat and swelling in legs/feet.  Gastrointestinal: Positive for constipation. Negative for blood in stool and diarrhea.  Endocrine: Negative for cold intolerance, heat intolerance and increased urination.  Genitourinary: Negative for difficulty urinating and vaginal dryness.  Musculoskeletal: Positive for arthralgias and joint pain. Negative for joint swelling, myalgias, muscle weakness, morning stiffness, muscle tenderness and myalgias.  Skin: Negative for color change, rash, hair loss, skin tightness, ulcers and  sensitivity to sunlight.  Allergic/Immunologic: Negative for susceptible to infections.  Neurological: Positive for numbness. Negative for dizziness, memory loss, night sweats and weakness.  Hematological: Negative for bruising/bleeding tendency and swollen glands.  Psychiatric/Behavioral: Positive for sleep disturbance. Negative for depressed mood. The patient is not nervous/anxious.     PMFS History:  Patient Active Problem List   Diagnosis Date Noted  . Acute left-sided low back pain 12/08/2018  . History of fusion of cervical spine 10/07/2018  . Cervical stenosis of spine 09/28/2018  . Spinal stenosis of cervical region 09/15/2018  . Personal history of calcium pyrophosphate deposition disease (CPPD) 06/23/2018  . Hyperlipidemia 06/23/2018  . Vitamin D deficiency 06/23/2018  . Bilateral carpal tunnel syndrome 10/15/2017  . Double crush syndrome 10/15/2017  . Ductal carcinoma in situ (DCIS) of right breast 12/06/2014    Past Medical History:  Diagnosis Date  . Arthritis    hands  . Bell's palsy   . Cancer [facility])    breast  . Depression     Family History  Problem Relation Age of Onset  . Lung cancer Father   . Healthy Sister   . Healthy Brother   . Healthy Brother   . Healthy Brother   . Healthy Sister   . Healthy Son   . Healthy Son   . Healthy Daughter   . Healthy Daughter    Past Surgical History:  Procedure Laterality Date  . ANTERIOR CERVICAL DECOMP/DISCECTOMY FUSION N/A 09/28/2018   Procedure: C5-6, C6-7 ANTERIOR CERVICAL DECOMPRESSION/DISCECTOMY ALLOGRAFT;  Surgeon: Marybelle Killings, MD;  Location: [facility];  Service: Orthopedics;  Laterality: N/A;  . APPENDECTOMY    .  BREAST SURGERY Right    lumpectomy   . CERVICAL POLYPECTOMY    . COLONOSCOPY    . TUBAL LIGATION     Social History   Social History Narrative  . Not on file    There is no immunization history on file for this patient.   Objective: Vital Signs: BP 118/67 (BP Location: Right Arm, Patient  Position: Sitting, Cuff Size: Normal)   Pulse 66   Resp 14   Ht 5\' 7"  (1.702 m)   Wt 166 lb (75.3 kg)   LMP 10/23/2017   BMI 26.00 kg/m    Physical Exam Vitals signs and nursing note reviewed.  Constitutional:      Appearance: She is well-developed.  HENT:     Head: Normocephalic and atraumatic.  Eyes:     Conjunctiva/sclera: Conjunctivae normal.  Neck:     Musculoskeletal: Normal range of motion.  Cardiovascular:     Rate and Rhythm: Normal rate and regular rhythm.     Heart sounds: Normal heart sounds.  Pulmonary:     Effort: Pulmonary effort is normal.     Breath sounds: Normal breath sounds.  Abdominal:     General: Bowel sounds are normal.     Palpations: Abdomen is soft.  Lymphadenopathy:     Cervical: No cervical adenopathy.  Skin:    General: Skin is warm and dry.     Capillary Refill: Capillary refill takes less than 2 seconds.  Neurological:     Mental Status: She is alert and oriented to person, place, and time.  Psychiatric:        Behavior: Behavior normal.      Musculoskeletal Exam: C-spine thoracic and lumbar spine were in good range of motion.  Shoulder joints elbow joints wrist joint MCPs PIPs DIPs with good range of motion with no synovitis.  Hip joints knee joints ankles MTPs PIPs been good range of motion with no synovitis.  CDAI Exam: CDAI Score: - Patient Global: -; Provider Global: - Swollen: -; Tender: - Joint Exam   No joint exam has been documented for this visit   There is currently no information documented on the homunculus. Go to the Rheumatology activity and complete the homunculus joint exam.  Investigation: No additional findings.  Imaging: [place] Extrem Up Bilat Comp  Result Date: 07/07/2019 Ultrasound examination of bilateral hands was performed per EULAR recommendations. Using 12 MHz transducer, grayscale and power Doppler bilateral second, third, and fifth MCP joints and bilateral wrist joints both dorsal and volar aspects  were evaluated to look for synovitis or tenosynovitis. The findings were there was no synovitis or tenosynovitis on ultrasound examination.  Mild Doppler activity was noted over the radial aspect of both wrist.  Right median nerve was bifid and 0.21 cm squares which was greater than upper limits of normal and left median nerve was 0.13 cm squares which was greater than upper limits of normal. Impression: Ultrasound examination did not show any synovitis or tenosynovitis.  Mild Doppler activity was noted on the medial aspect of bilateral wrists.  Bilateral median nerves were enlarged.  Xr Hand 2 View Left  Result Date: 06/25/2019 No MCP, PIP or DIP narrowing was noted.  No CMC, intercarpal, radiocarpal joint space narrowing was noted.  No erosive changes were noted.  No chondrocalcinosis was noted. Impression: Unremarkable x-ray of the hand.  Xr Hand 2 View Right  Result Date: 06/25/2019 No MCP, PIP or DIP narrowing was noted.  No CMC, intercarpal, radiocarpal joint space narrowing  was noted.  No erosive changes were noted.  No chondrocalcinosis was noted. Impression: Unremarkable x-ray of the hand.  Xr Shoulder Left  Result Date: 06/25/2019 No glenohumeral joint space narrowing was noted.  No acromioclavicular joint space narrowing was noted.  No chondrocalcinosis was noted. Impression: Unremarkable x-ray of the shoulder joint.  Xr Shoulder Right  Result Date: 06/25/2019 No glenohumeral joint space narrowing was noted.  No acromioclavicular joint space narrowing was noted.  No chondrocalcinosis was noted. Impression: Unremarkable x-ray of the shoulder joint.   Recent Labs: No results found for: WBC, HGB, PLT, NA, K, CL, CO2, GLUCOSE, BUN, CREATININE, BILITOT, ALKPHOS, AST, ALT, PROT, ALBUMIN, CALCIUM, GFRAA, QFTBGOLD, QFTBGOLDPLUS  Speciality Comments: No specialty comments available.  Procedures:  No procedures performed Allergies: Patient has no known allergies.   Assessment / Plan:      Visit Diagnoses: Pain in both hands -patient continues to have pain and stiffness in her hands.  No synovitis was noted.  We ordered some labs last visit which were not drawn.  I will obtain labs today.  XR unremarkable, NCV mild right carpal tunnel, RF<14, ANA-, sed rate 6, uric acid 4.2. hand exercises given.  At the last visit ACE level, magnesium, iron studies, 14 338, anti-CCP were ordered.  The blood specimens were lost in the lab and could not be traced today.  We drew the left blood again today.  Patient will not be charged a phlebotomy fee by the lab.  I will contact her once the lab results are available.  I also reviewed the ultrasound findings with her.  She had mild hyperemia in her wrist joints.  I am uncertain if is due to some underlying autoimmune process or to CPPD.  She does not like taking any medications.  She states she is taking NSAIDs in the past and would like to avoid taking any medications.  Patient states that exercises have been helpful.  I will also give her a list of natural anti-inflammatories.  She will notify me if she develops any increased symptoms.  Chronic pain of both shoulders - XR unremarkable, exercises were given at the last visit.  Patient has been doing exercises.  Personal history of calcium pyrophosphate deposition disease (CPPD) - No x-rays for review. Has taken Mobic in the past  Carpal tunnel syndrome of right wrist - Mild on NCV  Spinal stenosis of cervical region  History of fusion of cervical spine  Ductal carcinoma in situ (DCIS) of right breast  Vitamin D deficiency  History of hyperlipidemia  Orders: No orders of the defined types were placed in this encounter.  No orders of the defined types were placed in this encounter.   Face-to-face time spent with patient was 25 minutes. Greater than 50% of time was spent in counseling and coordination of care.  Follow-Up Instructions: Return in about 2 months (around 09/11/2019) for Pain in  multiple joints.   Bo Merino, MD  Note - This record has been created using Editor, commissioning.  Chart creation errors have been sought, but may not always  have been located. Such creation errors do not reflect on  the standard of medical care.

## 2019-07-09 ENCOUNTER — Inpatient Hospital Stay: Admission: RE | Admit: 2019-07-09 | Payer: BC Managed Care – PPO | Source: Ambulatory Visit

## 2019-07-12 ENCOUNTER — Encounter: Payer: Self-pay | Admitting: Rheumatology

## 2019-07-12 ENCOUNTER — Ambulatory Visit (INDEPENDENT_AMBULATORY_CARE_PROVIDER_SITE_OTHER): Payer: BC Managed Care – PPO | Admitting: Rheumatology

## 2019-07-12 ENCOUNTER — Other Ambulatory Visit: Payer: Self-pay

## 2019-07-12 VITALS — BP 118/67 | HR 66 | Resp 14 | Ht 67.0 in | Wt 166.0 lb

## 2019-07-12 DIAGNOSIS — M79642 Pain in left hand: Secondary | ICD-10-CM

## 2019-07-12 DIAGNOSIS — M25511 Pain in right shoulder: Secondary | ICD-10-CM

## 2019-07-12 DIAGNOSIS — Z8639 Personal history of other endocrine, nutritional and metabolic disease: Secondary | ICD-10-CM

## 2019-07-12 DIAGNOSIS — D0511 Intraductal carcinoma in situ of right breast: Secondary | ICD-10-CM

## 2019-07-12 DIAGNOSIS — Z981 Arthrodesis status: Secondary | ICD-10-CM

## 2019-07-12 DIAGNOSIS — G8929 Other chronic pain: Secondary | ICD-10-CM

## 2019-07-12 DIAGNOSIS — M79641 Pain in right hand: Secondary | ICD-10-CM

## 2019-07-12 DIAGNOSIS — M25512 Pain in left shoulder: Secondary | ICD-10-CM

## 2019-07-12 DIAGNOSIS — M4802 Spinal stenosis, cervical region: Secondary | ICD-10-CM

## 2019-07-12 DIAGNOSIS — Z8739 Personal history of other diseases of the musculoskeletal system and connective tissue: Secondary | ICD-10-CM

## 2019-07-12 DIAGNOSIS — G5601 Carpal tunnel syndrome, right upper limb: Secondary | ICD-10-CM | POA: Diagnosis not present

## 2019-07-12 DIAGNOSIS — E559 Vitamin D deficiency, unspecified: Secondary | ICD-10-CM

## 2019-07-12 NOTE — Progress Notes (Signed)
Pharmacy Note  Subjective:  Patient presents today to the [facility] to see Dr. Estanislado Pandy.   Patient seen by the pharmacist for counseling on natural anti-inflammatories.  Objective: Current Outpatient Medications on File Prior to Visit  Medication Sig Dispense Refill  . lovastatin (MEVACOR) 20 MG tablet Take 20 mg by mouth at bedtime.  3  . Multiple Vitamins-Minerals (MULTIVITAMIN WOMENS 50+ ADV PO) Take by mouth daily.    . tamoxifen (NOLVADEX) 20 MG tablet Take 20 mg by mouth daily.  6  . Vitamin D, Ergocalciferol, (DRISDOL) 1.25 MG (50000 UT) CAPS capsule Take 50,000 Units by mouth every 7 (seven) days.    Marland Kitchen gabapentin (NEURONTIN) 100 MG capsule Take 3 capsules (300 mg total) by mouth 3 (three) times daily. (Patient not taking: Reported on 06/25/2019) 90 capsule 0  . gabapentin (NEURONTIN) 300 MG capsule Take 300 mg by mouth 3 (three) times daily as needed (neuropathy).    . meloxicam (MOBIC) 7.5 MG tablet TAKE 1 TABLET BY MOUTH EVERY DAY (Patient not taking: Reported on 06/25/2019) 30 tablet 1  . methocarbamol (ROBAXIN) 500 MG tablet Take 1 tablet (500 mg total) by mouth every 8 (eight) hours as needed for muscle spasms. (Patient not taking: Reported on 06/25/2019) 30 tablet 1  . oxyCODONE-acetaminophen (PERCOCET) 5-325 MG tablet Take 1-2 tablets by mouth every 6 (six) hours as needed for severe pain. (Patient not taking: Reported on 06/25/2019) 30 tablet 0  . predniSONE (DELTASONE) 10 MG tablet Take 1 tablet (10 mg total) by mouth daily with breakfast. (Patient not taking: Reported on 06/25/2019) 10 tablet 0   No current facility-administered medications on file prior to visit.      Assessment/Plan:  Counseled on the purpose, proper use, and adverse effects of natural anti-inflammatories including upset stomach and increased bleeding risk.  Encouraged patient to add one medication at a time and to include on medication list to monitor for adverse effects and drug  interactions.  Given educational handout with recommended doses.  All questions encouraged and answered.  Instructed patient to call with any questions or concerns.  Mariella Saa, PharmD, [place], [place]ty Pharmacist [phone]  07/12/2019 11:56 AM

## 2019-07-15 LAB — IRON,TIBC AND FERRITIN PANEL
%SAT: 34 % (calc) (ref 16–45)
Ferritin: 42 ng/mL (ref 16–232)
Iron: 107 ug/dL (ref 45–160)
TIBC: 313 mcg/dL (calc) (ref 250–450)

## 2019-07-15 LAB — 14-3-3 ETA PROTEIN: 14-3-3 eta Protein: <0.2 ng/mL (ref <0.2)

## 2019-07-15 LAB — MAGNESIUM: Magnesium: 2 mg/dL (ref 1.5–2.5)

## 2019-07-15 LAB — ANGIOTENSIN CONVERTING ENZYME: Angiotensin-Converting Enzyme: 42 U/L (ref 9–67)

## 2019-07-15 LAB — CYCLIC CITRUL PEPTIDE ANTIBODY, IGG: Cyclic Citrullin Peptide Ab: <16 UNITS

## 2019-07-16 NOTE — Progress Notes (Signed)
All the labs are within normal limits.

## 2019-07-20 ENCOUNTER — Ambulatory Visit
Admission: RE | Admit: 2019-07-20 | Discharge: 2019-07-20 | Disposition: A | Discharge status: Home / Self Care | Payer: BC Managed Care – PPO | Source: Ambulatory Visit | Attending: Surgical Oncology | Admitting: Surgical Oncology

## 2019-07-20 DIAGNOSIS — Z1211 Encounter for screening for malignant neoplasm of colon: Secondary | ICD-10-CM

## 2019-08-03 ENCOUNTER — Ambulatory Visit: Payer: BC Managed Care – PPO | Admitting: Rheumatology

## 2019-09-16 ENCOUNTER — Ambulatory Visit: Payer: BC Managed Care – PPO | Admitting: Rheumatology

## 2019-11-05 ENCOUNTER — Encounter: Payer: Self-pay | Admitting: Orthopaedic Surgery

## 2019-11-05 ENCOUNTER — Ambulatory Visit: Payer: BC Managed Care – PPO | Admitting: Orthopaedic Surgery

## 2019-11-05 ENCOUNTER — Ambulatory Visit: Payer: Self-pay

## 2019-11-05 ENCOUNTER — Other Ambulatory Visit: Payer: Self-pay

## 2019-11-05 VITALS — BP 145/83 | HR 72 | Ht 67.0 in | Wt 162.0 lb

## 2019-11-05 DIAGNOSIS — Z981 Arthrodesis status: Secondary | ICD-10-CM

## 2019-11-05 DIAGNOSIS — M542 Cervicalgia: Secondary | ICD-10-CM

## 2019-11-05 NOTE — Progress Notes (Addendum)
Office Visit Note   Patient: Bailey Berry           Date of Birth: [birth year]           MRN: [id number] Visit Date: 11/05/2019              Requested by: Adrian Prows, [address] [address] PCP: Adrian Prows, PA-C   Assessment & Plan: Visit Diagnoses:  1. Neck pain   2. History of fusion of cervical spine     Plan: Patient has mild carpal tunnel not currently symptomatic.  I think her pain is muscular x-rays look good reflexes are normal and she has no tension signs in her neck.  She has had a significant amount of increased bake reorders and has been very busy at work putting in long hours.  We will set up for some physical therapy treatment in [place].  Recheck 6 weeks.  We reviewed her preop MRI and she only had 2 significant levels with cervical disease in both of them are solidly fused.  We discussed using Aspercreme single she can get her husband to help massages and will proceed with therapy recheck 6 weeks.  Follow-Up Instructions: Return in about 6 weeks (around 12/17/2019).   Orders:  Orders Placed This Encounter  Procedures  . XR Cervical Spine 2 or 3 views  . Ambulatory referral to Physical Therapy   No orders of the defined types were placed in this encounter.     Procedures: No procedures performed   Clinical Data: No additional findings.   Subjective: Chief Complaint  Patient presents with  . Neck - Pain    HPI patient's had 3 weeks history of significant increased pain in her neck.  She had Covid and then started having a significant increase orders of baking goods and states she has been very very busy.  She had increased discomfort in the trapezium muscle posteriorly.  No numbness or tingling's in her hands.  Hands do not wake her up at night she has not dropped objects.  Previous nerve conduction velocity showed borderline to mild carpal tunnel on the right hand.  Principal problem is been posterior  cervical pain but not like the pain she had preoperatively which was radicular.  Two-level cervical fusion C5-6 C6-7 was on 09/28/2018.  Review of Systems updated unchanged other than as mentioned in HPI.   Objective: Vital Signs: BP (!) 145/83   Pulse 72   Ht 5\' 7"  (1.702 m)   Wt 162 lb (73.5 kg)   LMP 10/23/2017   BMI 25.37 kg/m   Physical Exam Constitutional:      Appearance: She is well-developed.  HENT:     Head: Normocephalic.     Right Ear: External ear normal.     Left Ear: External ear normal.  Eyes:     Pupils: Pupils are equal, round, and reactive to light.  Neck:     Thyroid: No thyromegaly.     Trachea: No tracheal deviation.  Cardiovascular:     Rate and Rhythm: Normal rate.  Pulmonary:     Effort: Pulmonary effort is normal.  Abdominal:     Palpations: Abdomen is soft.  Skin:    General: Skin is warm and dry.  Neurological:     Mental Status: She is alert and oriented to person, place, and time.  Psychiatric:        Behavior: Behavior normal.     Ortho Exam  reflexes are 2+ upper extremities to 3+ and symmetrical.  No brachial plexus tenderness she has tenderness in the parous spinal posterior muscles and trapezial muscle.  No impingement of the shoulders.  No weakness to upper extremities normal heel toe gait.  Specialty Comments:  No specialty comments available.  Imaging: XR Cervical Spine 2 or 3 views  Result Date: 11/05/2019 AP lateral cervical spine x-ray are obtained and reviewed.  This shows two-level cervical fusion C5-6, C6-7 with good graft incorporation.  No additional level changes compared to preoperative images.  No adjacent level changes. Impression satisfactory two-level cervical fusion as described above.    PMFS History: Patient Active Problem List   Diagnosis Date Noted  . Acute left-sided low back pain 12/08/2018  . History of fusion of cervical spine 10/07/2018  . Personal history of calcium pyrophosphate deposition disease  (CPPD) 06/23/2018  . Hyperlipidemia 06/23/2018  . Vitamin D deficiency 06/23/2018  . Bilateral carpal tunnel syndrome 10/15/2017  . Double crush syndrome 10/15/2017  . 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 Arthritis    hands  . Bell's palsy   . Cancer [facility])    breast  . Depression     Family History  Problem Relation Age of Onset  . Lung cancer Father   . Healthy Sister   . Healthy Brother   . Healthy Brother   . Healthy Brother   . Healthy Sister   . Healthy Son   . Healthy Son   . Healthy Daughter   . Healthy Daughter     Past Surgical History:  Procedure Laterality Date  . ANTERIOR CERVICAL DECOMP/DISCECTOMY FUSION N/A 09/28/2018   Procedure: C5-6, C6-7 ANTERIOR CERVICAL DECOMPRESSION/DISCECTOMY ALLOGRAFT;  Surgeon: Marybelle Killings, MD;  Location: [facility];  Service: Orthopedics;  Laterality: N/A;  . APPENDECTOMY    . BREAST SURGERY Right    lumpectomy   . CERVICAL POLYPECTOMY    . COLONOSCOPY    . TUBAL LIGATION     Social History   Occupational History  . Not on file  Tobacco Use  . Smoking status: Never Smoker  . Smokeless tobacco: Never Used  Substance and Sexual Activity  . Alcohol use: Yes    Comment: rarely  . Drug use: Never  . Sexual activity: Not on file

## 2020-01-20 IMAGING — CT CT VIRTUAL COLONOSCOPY SCREENING
2 of 9 series · 12 of 46 positions shown, 14 images · non-contrast
Comparison: None.

CLINICAL DATA: Screen for colon cancer. Prior appendectomy. History
of right breast cancer, status post radiation and lumpectomy.

EXAM:
CT VIRTUAL COLONOSCOPY SCREENING
TECHNIQUE: The patient was given a standard Mag citrate bowel preparation with
Gastrografin and barium for fluid and stool tagging respectively.
The quality of the bowel preparation is moderate. Automated CO2
insufflation of the colon was performed prior to image acquisition
and colonic distention is excellent. Image post processing was used
to generate a 3D endoluminal fly-through projection of the colon and
to electronically subtract stool/fluid as appropriate.

[Series 5: supine colon 3.00 br40 s3 cor supine · coronal · 0.71mm/px · 3 of 93 slices shown]
[im 24/93  soft-tissue]
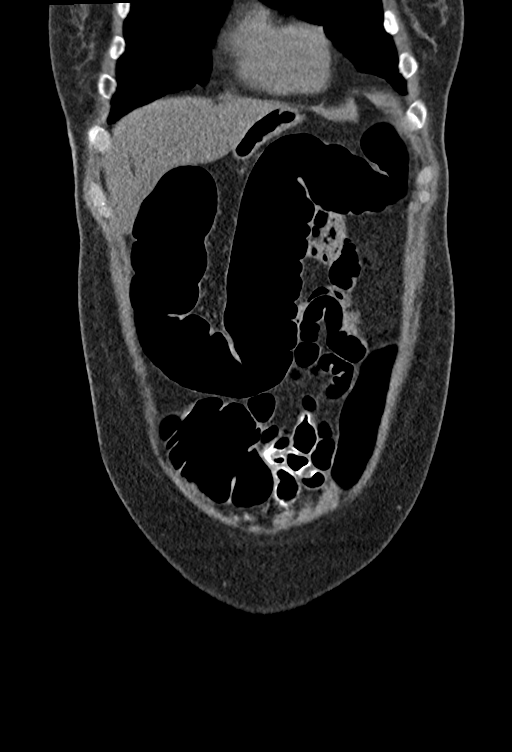
[im 47/93  soft-tissue]
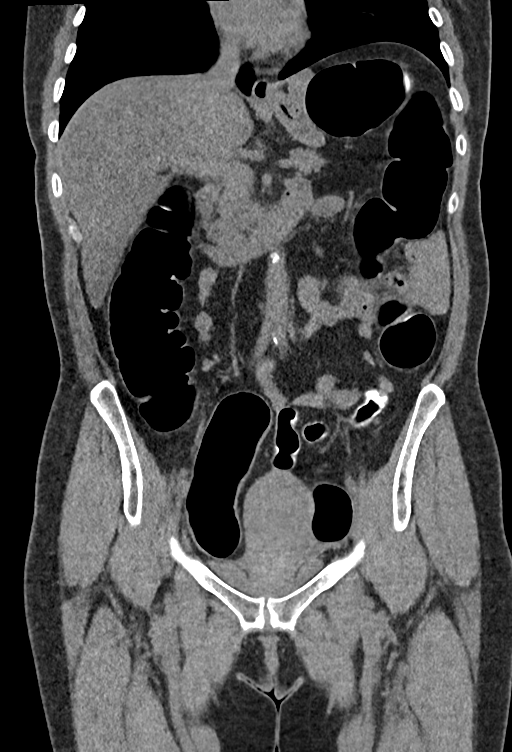
[im 70/93  soft-tissue]
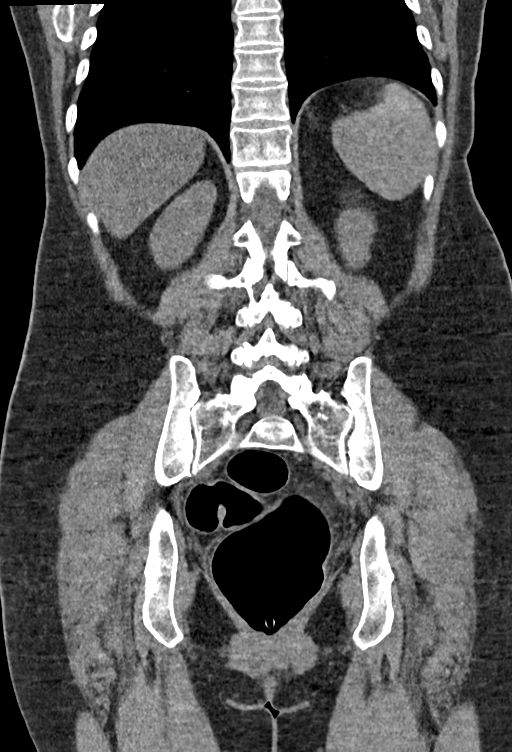

[Series 10: prone colon 1.50 br40 s3 prone thin · axial · 0.78mm/px · z∈[+1115,+1559]mm · 9 of 371 slices shown, 11 images]
[im 38/371  soft-tissue]
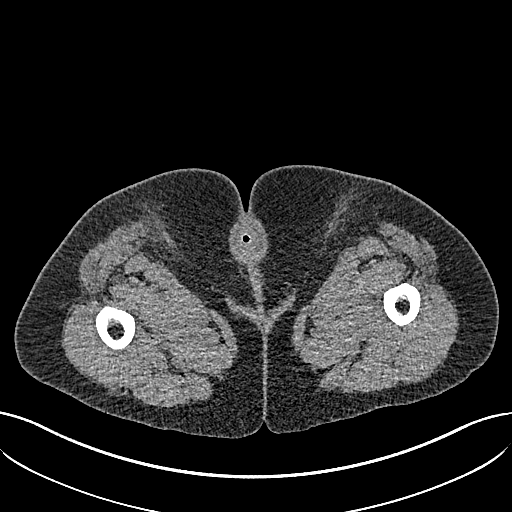
[im 38/371  bone]
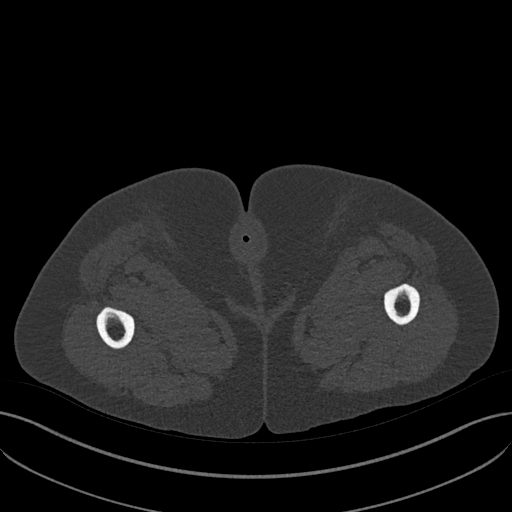
[im 75/371  soft-tissue]
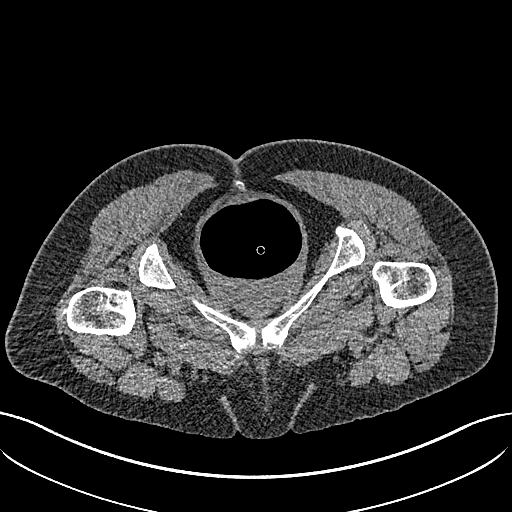
[im 112/371  soft-tissue]
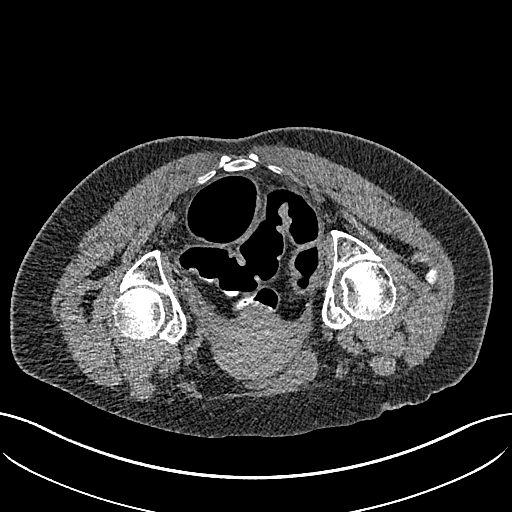
[im 149/371  soft-tissue]
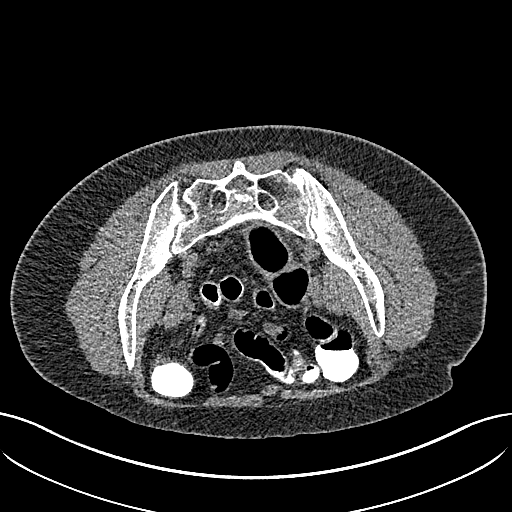
[im 186/371  soft-tissue]
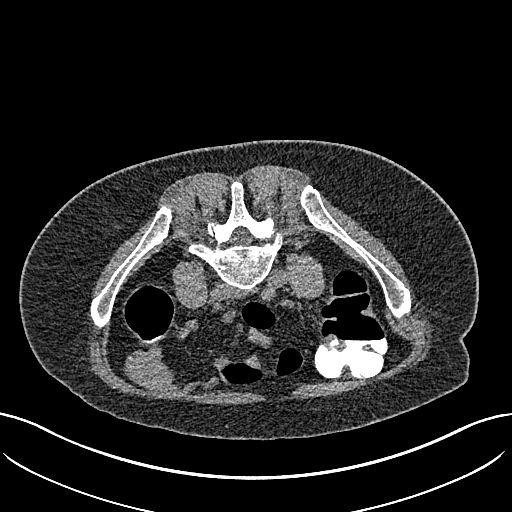
[im 223/371  soft-tissue]
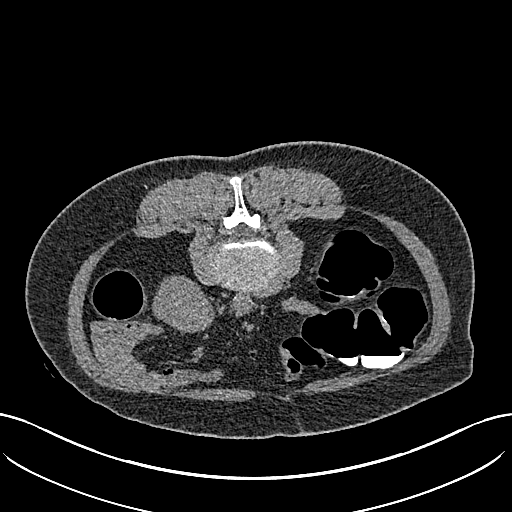
[im 260/371  soft-tissue]
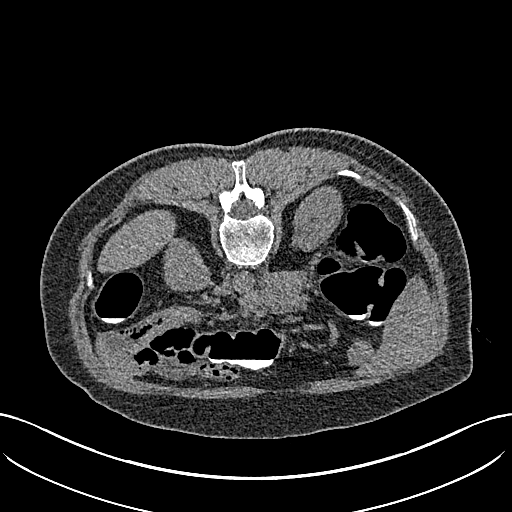
[im 297/371  soft-tissue]
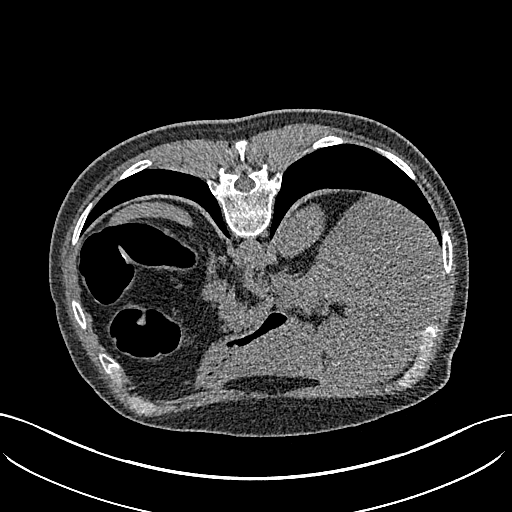
[im 334/371  soft-tissue]
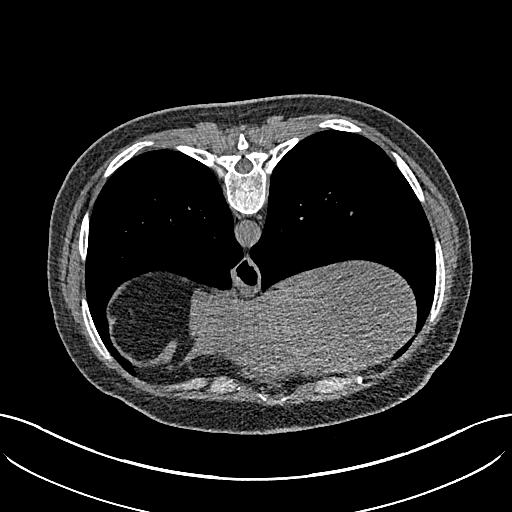
[im 334/371  bone]
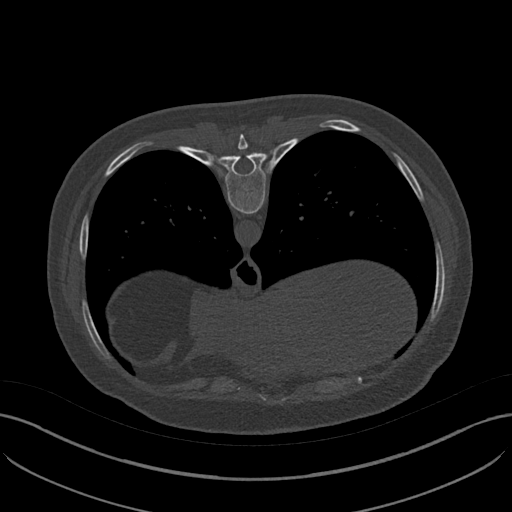

[12 of 46 positions shown; findings below may reference images not displayed]

FINDINGS: VIRTUAL COLONOSCOPY

No significant colonic polyp, mass, apple core lesion, or stricture.

Mild colonic diverticulosis, without evidence of diverticulitis.

No evidence of bowel obstruction.

Prior appendectomy.

Virtual colonoscopy is not designed to detect diminutive polyps
(i.e., less than or equal to 5 mm), the presence or absence of which
may not affect clinical management.

CT ABDOMEN AND PELVIS WITHOUT CONTRAST

Lower chest: Lung bases are clear.

Hepatobiliary: Moderate geographic hepatic steatosis.

Gallbladder is unremarkable. No intrahepatic or extrahepatic ductal
dilatation.

Pancreas: Within normal limits.

Spleen: Within normal limits.

Adrenals/Urinary Tract: Adrenal glands are within normal limits.

Kidneys are within normal limits.  No hydronephrosis.

Bladder is underdistended but unremarkable.

Stomach/Bowel: Stomach is within normal limits.

Visualized bowel is described above.

Vascular/Lymphatic: No evidence of abdominal aortic aneurysm.

Atherosclerotic calcifications of the abdominal aorta and branch
vessels.

No suspicious abdominopelvic lymphadenopathy.

Reproductive: Uterus is within normal limits.

Bilateral ovaries are within normal limits.

Other: No abdominopelvic ascites.

Musculoskeletal: Mild degenerative changes of the visualized
thoracolumbar spine.
IMPRESSION: No significant colonic polyp, mass, apple core lesion, or stricture.

## 2020-06-06 ENCOUNTER — Other Ambulatory Visit: Payer: Self-pay

## 2020-06-06 ENCOUNTER — Ambulatory Visit: Payer: BC Managed Care – PPO | Admitting: Physician Assistant

## 2020-06-06 ENCOUNTER — Encounter: Payer: Self-pay | Admitting: Physician Assistant

## 2020-06-06 DIAGNOSIS — Z1283 Encounter for screening for malignant neoplasm of skin: Secondary | ICD-10-CM | POA: Diagnosis not present

## 2020-06-06 DIAGNOSIS — L710 Perioral dermatitis: Secondary | ICD-10-CM | POA: Diagnosis not present

## 2020-06-06 MED ORDER — METRONIDAZOLE 0.75 % EX CREA: TOPICAL_CREAM | Freq: Two times a day (BID) | CUTANEOUS | 6 refills | Status: AC

## 2020-06-06 MED ORDER — MINOCYCLINE HCL 100 MG PO TABS: 100.0000 mg | ORAL_TABLET | Freq: Two times a day (BID) | ORAL | 6 refills | Status: AC

## 2020-06-07 NOTE — Progress Notes (Signed)
   New Patient   Subjective  Bailey Berry is a 59 y.o. female who presents for the following: Annual Exam (LEFT CHEST, ABDOMEN ISK?).   The following portions of the chart were reviewed this encounter and updated as appropriate: Tobacco  Allergies  Meds  Problems  Med Hx  Surg Hx  Fam Hx      Objective  Well appearing patient in no apparent distress; mood and affect are within normal limits.  A full examination was performed including scalp, head, eyes, ears, nose, lips, neck, chest, axillae, abdomen, back, buttocks, bilateral upper extremities, bilateral lower extremities, hands, feet, fingers, toes, fingernails, and toenails. All findings within normal limits unless otherwise noted below.  Objective  Head - to toe: No atypical nevi No signs of non-mole skin cancer.   Objective  chin: Pink papules   Assessment & Plan  Screening exam for skin cancer Head - to toe  Yearly skin exam  Perioral dermatitis chin  Ordered Medications: minocycline (DYNACIN) 100 MG tablet metroNIDAZOLE (METROCREAM) 0.75 % cream    I, Agnieszka Newhouse, PA-C, have reviewed all documentation for this visit. The documentation on 06/07/20 for the exam, diagnosis, procedures, and orders are all accurate and complete.

## 2020-07-25 NOTE — Addendum Note (Signed)
Addended by: Lavonna Monarch on: 07/25/2020 06:13 PM   Modules accepted: Level of Service

## 2020-12-28 ENCOUNTER — Ambulatory Visit (INDEPENDENT_AMBULATORY_CARE_PROVIDER_SITE_OTHER): Payer: BC Managed Care – PPO | Admitting: Orthopaedic Surgery

## 2020-12-28 ENCOUNTER — Ambulatory Visit: Payer: Self-pay

## 2020-12-28 ENCOUNTER — Other Ambulatory Visit: Payer: Self-pay

## 2020-12-28 DIAGNOSIS — M25531 Pain in right wrist: Secondary | ICD-10-CM

## 2020-12-28 DIAGNOSIS — M654 Radial styloid tenosynovitis [de Quervain]: Secondary | ICD-10-CM | POA: Diagnosis not present

## 2020-12-28 MED ORDER — METHYLPREDNISOLONE ACETATE 40 MG/ML IJ SUSP
40.0000 mg | INTRAMUSCULAR | Status: AC | PRN
  Administered 2020-12-28: 40 mg

## 2020-12-28 MED ORDER — LIDOCAINE HCL 1 % IJ SOLN
0.3000 mL | INTRAMUSCULAR | Status: AC | PRN
  Administered 2020-12-28: .3 mL

## 2020-12-28 MED ORDER — BUPIVACAINE HCL 0.5 % IJ SOLN
0.3300 mL | INTRAMUSCULAR | Status: AC | PRN
Start: 2020-12-28 — End: 2020-12-28
  Administered 2020-12-28: .33 mL

## 2020-12-28 NOTE — Progress Notes (Signed)
Office Visit Note   Patient: Bailey Berry           Date of Birth: Oct 05, 1960           MRN: [id number] Visit Date: 12/28/2020              Requested by: Adrian Prows, [place] [address] PCP: Adrian Prows, PA-C   Assessment & Plan: Visit Diagnoses:  1. Pain in right wrist   2. Radial styloid tenosynovitis (de quervain)     Plan: Injection performed which she tolerated well.  We will place her in a thumb gutter splint which will release to immobilize the thumb to help with her tenosynovitis of the radial styloid and first dorsal compartment.  We discussed possible surgical intervention if she is not better.  Return if she has persistent symptoms.  Follow-Up Instructions: No follow-ups on file.   Orders:  Orders Placed This Encounter  Procedures  . Hand/UE Inj: R extensor compartment 1  . XR Wrist 2 Views Right   No orders of the defined types were placed in this encounter.     Procedures: Hand/UE Inj: R extensor compartment 1 for de Quervain's tenosynovitis on 12/28/2020 4:02 PM Medications: 0.3 mL lidocaine 1 %; 0.33 mL bupivacaine 0.5 %; 40 mg methylPREDNISolone acetate 40 MG/ML      Clinical Data: No additional findings.   Subjective: Chief Complaint  Patient presents with  . Right Wrist - Pain    HPI 60 year old female had previous anterior cervical fusion by me couple years ago seen with new onset of right wrist pain which has been present for few months but severe in the last week.  She has been wearing a wrist splint and states the pain is extremely painful when she grabs objects gripping particularly if it requires more squeezing.  Pain wakes her up at night even with the brace.  She denies associated neck pain with this.  Review of Systems all other systems noncontributory to HPI.  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   Objective: Vital Signs: LMP 10/23/2017   Physical Exam Constitutional:       Appearance: She is well-developed.  HENT:     Head: Normocephalic.     Right Ear: External ear normal.     Left Ear: External ear normal.  Eyes:     Pupils: Pupils are equal, round, and reactive to light.  Neck:     Thyroid: No thyromegaly.     Trachea: No tracheal deviation.  Cardiovascular:     Rate and Rhythm: Normal rate.  Pulmonary:     Effort: Pulmonary effort is normal.  Abdominal:     Palpations: Abdomen is soft.  Skin:    General: Skin is warm and dry.  Neurological:     Mental Status: She is alert and oriented to person, place, and time.  Psychiatric:        Behavior: Behavior normal.     Ortho Exam patient has negative Spurling good cervical range of motion well-healed incision left anterior neck.  Upper extremity reflexes are 2+.  There is prominence and extreme tenderness first dorsal compartment positive Finkelstein test negative carpal canal.  No snuffbox tenderness.  EPL the rest of the dorsal compartments are all normal.  Exquisite tenderness with compression over the first dorsal compartment.  Specialty Comments:  No specialty comments available.  Imaging: No results found.   PMFS History: Patient Active Problem List   Diagnosis  Date Noted  . Radial styloid tenosynovitis (de quervain) 12/28/2020  . Acute left-sided low back pain 12/08/2018  . History of fusion of cervical spine 10/07/2018  . Personal history of calcium pyrophosphate deposition disease (CPPD) 06/23/2018  . Hyperlipidemia 06/23/2018  . Vitamin D deficiency 06/23/2018  . Bilateral carpal tunnel syndrome 10/15/2017  . Double crush syndrome 10/15/2017  . 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 Arthritis    hands  . Bell's palsy   . Cancer [facility])    breast  . Depression     Family History  Problem Relation Age of Onset  . Lung cancer Father   . Healthy Sister   . Healthy Brother   . Healthy Brother   . Healthy  Brother   . Healthy Sister   . Healthy Son   . Healthy Son   . Healthy Daughter   . Healthy Daughter     Past Surgical History:  Procedure Laterality Date  . ANTERIOR CERVICAL DECOMP/DISCECTOMY FUSION N/A 09/28/2018   Procedure: C5-6, C6-7 ANTERIOR CERVICAL DECOMPRESSION/DISCECTOMY ALLOGRAFT;  Surgeon: Marybelle Killings, MD;  Location: [facility];  Service: Orthopedics;  Laterality: N/A;  . APPENDECTOMY    . BREAST SURGERY Right    lumpectomy   . CERVICAL POLYPECTOMY    . COLONOSCOPY    . TUBAL LIGATION     Social History   Occupational History  . Not on file  Tobacco Use  . Smoking status: Never Smoker  . Smokeless tobacco: Never Used  Vaping Use  . Vaping Use: Never used  Substance and Sexual Activity  . Alcohol use: Yes    Comment: rarely  . Drug use: Never  . Sexual activity: Not on file

## 2021-01-03 ENCOUNTER — Ambulatory Visit: Payer: BC Managed Care – PPO | Admitting: Orthopaedic Surgery

## 2023-08-22 ENCOUNTER — Other Ambulatory Visit (INDEPENDENT_AMBULATORY_CARE_PROVIDER_SITE_OTHER): Payer: Self-pay

## 2023-08-22 ENCOUNTER — Encounter: Payer: Self-pay | Admitting: Orthopedic Surgery

## 2023-08-22 ENCOUNTER — Ambulatory Visit (INDEPENDENT_AMBULATORY_CARE_PROVIDER_SITE_OTHER): Payer: BLUE CROSS/BLUE SHIELD | Admitting: Orthopedic Surgery

## 2023-08-22 DIAGNOSIS — M25511 Pain in right shoulder: Secondary | ICD-10-CM | POA: Diagnosis not present

## 2023-08-22 DIAGNOSIS — M79641 Pain in right hand: Secondary | ICD-10-CM

## 2023-08-22 MED ORDER — MELOXICAM 15 MG PO TABS: ORAL_TABLET | ORAL | 0 refills | Status: DC

## 2023-08-22 MED ORDER — METHYLPREDNISOLONE 4 MG PO TBPK: ORAL_TABLET | ORAL | 0 refills | Status: AC

## 2023-08-22 NOTE — Progress Notes (Signed)
Office Visit Note   Patient: Bailey Berry           Date of Birth: 10/22/1960           MRN: [id number] Visit Date: 08/22/2023 Requested by: Terance Hart, PA-C [address] [place],  [place] [address] PCP: Charlette Caffey  Subjective: Chief Complaint  Patient presents with   Right Shoulder - Pain   Right Wrist - Pain    HPI: Bailey Berry is a 62 y.o. female who presents to the office reporting right wrist and right shoulder pain of several months duration.  She reports pain primarily in the anterior aspect of the shoulder.  The pain wakes her from sleep at night.  Takes ibuprofen and occasionally with minimal relief.  Denies any neck pain numbness and tingling or radicular pain.  She states her right wrist also gets stiff and her hand gets "stuck".  No prior shoulder surgery.  Patient did have neck surgery in 2019 and she did well with that.  Patient denies any popping or grinding in the shoulder.  Hard for her to hold her grandchild..                ROS: All systems reviewed are negative as they relate to the chief complaint within the history of present illness.  Patient denies fevers or chills.  Assessment & Plan: Visit Diagnoses:  1. Pain in right hand   2. Right shoulder pain, unspecified chronicity     Plan: Impression is right wrist pain unclear etiology.  Radiographs and exam are normal.  She only has a very small 2 mm cyst around the first dorsal compartment with negative Finkelstein's test.  No real plan for the wrist at this time.  The shoulder looks like early frozen shoulder.  She is having trouble getting the arm behind her back.  Rotator cuff strength is intact radiographs are normal.  Plan for both the shoulder and wrist is Medrol Dosepak 6-day course with Mobic 3 weeks to follow.  4-week return with decision for or against wrist and for shoulder injection at that time  Follow-Up Instructions: No follow-ups on file.   Orders:  Orders Placed This  Encounter  Procedures   XR Wrist 2 Views Right   XR Hand Complete Right   XR Shoulder Right   Meds ordered this encounter  Medications   meloxicam (MOBIC) 15 MG tablet    Sig: Take one tablet daily. Start after completion of medrol dose pack.    Dispense:  30 tablet    Refill:  0   methylPREDNISolone (MEDROL DOSEPAK) 4 MG TBPK tablet    Sig: Take as directed.    Dispense:  21 tablet    Refill:  0      Procedures: No procedures performed   Clinical Data: No additional findings.  Objective: Vital Signs: LMP 10/23/2017   Physical Exam:  Constitutional: Patient appears well-developed HEENT:  Head: Normocephalic Eyes:EOM are normal Neck: Normal range of motion Cardiovascular: Normal rate Pulmonary/chest: Effort normal Neurologic: Patient is alert Skin: Skin is warm Psychiatric: Patient has normal mood and affect  Ortho Exam: Ortho exam demonstrates 5 out of 5 grip EPL FPL interosseous wrist flexion extension bicep tricep and deltoid strength.  She has good cervical spine range of motion.  She has full wrist range of motion with a small cyst over the first dorsal compartment pulley.  Negative Finkelstein's test.  No snuffbox tenderness.  Wrist range of motion otherwise symmetric  between sides.  Shoulder has excellent rotator cuff strength to internal/external rotation.  No coarse grinding or crepitus is present.  Passive range of motion on the right is 60/95/160.  Range of motion on the left is 70/100/175.  No coarse grinding or popping with internal/external rotation of the right shoulder.  Most notably she is can easily get her left hand up between her shoulder blades but the right hand lags around the posterior superior iliac crest.  Specialty Comments:  No specialty comments available.  Imaging: No results found.   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   Past Medical History:  Diagnosis Date   Arthritis    hands   Bell's palsy    Cancer (HCC)    breast   Depression     Family History  Problem Relation Age of Onset   Lung cancer Father    Healthy Sister    Healthy Brother    Healthy Brother    Healthy Brother    Healthy Sister    Healthy Son    Healthy Son    Healthy Daughter    Healthy Daughter     Past Surgical History:  Procedure Laterality Date   ANTERIOR CERVICAL DECOMP/DISCECTOMY FUSION N/A 09/28/2018   Procedure: C5-6, C6-7 ANTERIOR CERVICAL DECOMPRESSION/DISCECTOMY ALLOGRAFT;  Surgeon: Eldred Manges, MD;  Location: [facility];  Service: Orthopedics;  Laterality: N/A;   APPENDECTOMY     BREAST SURGERY Right    lumpectomy    CERVICAL POLYPECTOMY     COLONOSCOPY     TUBAL LIGATION     Social History   Occupational History   Not on file  Tobacco Use   Smoking status: Never   Smokeless tobacco: Never  Vaping Use   Vaping status: Never Used  Substance and Sexual Activity   Alcohol use: Yes    Comment: rarely   Drug use: Never   Sexual activity: Not on file

## 2023-09-18 ENCOUNTER — Other Ambulatory Visit: Payer: Self-pay | Admitting: Orthopedic Surgery

## 2023-09-22 ENCOUNTER — Ambulatory Visit (INDEPENDENT_AMBULATORY_CARE_PROVIDER_SITE_OTHER): Payer: BLUE CROSS/BLUE SHIELD | Admitting: Orthopedic Surgery

## 2023-09-22 DIAGNOSIS — M79641 Pain in right hand: Secondary | ICD-10-CM

## 2023-09-23 ENCOUNTER — Encounter: Payer: Self-pay | Admitting: Orthopedic Surgery

## 2023-09-23 NOTE — Progress Notes (Signed)
 Office Visit Note   Patient: Bailey Berry           Date of Birth: 1961/06/21           MRN: [id number] Visit Date: 09/22/2023 Requested by: Perri Bobie SAUNDERS, PA-C [address] [place],  [place] [address] PCP: Perri Bobie SAUNDERS DEVONNA  Subjective: Chief Complaint  Patient presents with   Other    Follow up right shoulder/wrist    HPI: Bailey Berry is a 63 y.o. female who presents to the office reporting continued right wrist pain.  The shoulder is actually doing reasonably well.  She has been taking Mobic  and finished a 6-day Medrol  Dosepak.  Her wrist is the thing is bothering her the most.  Localizes pain on the dorsal aspect of the wrist around the base of the second and third metacarpals.  No real volar pain or numbness and tingling.  She reports decreased strength in the wrist.  Patient wakes up with sharp wrist pain at times..                ROS: All systems reviewed are negative as they relate to the chief complaint within the history of present illness.  Patient denies fevers or chills.  Assessment & Plan: Visit Diagnoses:  1. Pain in right hand     Plan: Impression is improving right shoulder pain with nonoperative intervention.  Decision today was for or against injection.  Will hold off on injection of the shoulder for now.  The right wrist is a little bit more perplexing.  She does have some swelling as well as a small cyst over the first dorsal compartment but does not really seem to localize her symptoms there to a large degree.  Could be irritation of some branches of the radial sensory nerve from that small cyst.  Alternatively there may be degenerative changes or stress reaction in the bone Visible on plain radiographs.  Does not appear to be scaphoid related.  Based on duration of symptoms which is well over 6 weeks as well as failure of conservative treatment MRI indicated to evaluate the dorsal radial wrist pain and we can consider injection added based on the  pathology that is discovered with this scan.  Follow-up after that study.  Follow-Up Instructions: No follow-ups on file.   Orders:  Orders Placed This Encounter  Procedures   MR Wrist Right w/o contrast   No orders of the defined types were placed in this encounter.     Procedures: No procedures performed   Clinical Data: No additional findings.  Objective: Vital Signs: LMP 10/23/2017   Physical Exam:  Constitutional: Patient appears well-developed HEENT:  Head: Normocephalic Eyes:EOM are normal Neck: Normal range of motion Cardiovascular: Normal rate Pulmonary/chest: Effort normal Neurologic: Patient is alert Skin: Skin is warm Psychiatric: Patient has normal mood and affect  Ortho Exam: Ortho exam demonstrates full active and passive range of motion of the right shoulder with good rotator cuff strength and no coarse grinding or crepitus with internal/external rotation of that shoulder.  Cervical spine range of motion intact.  Right wrist has tenderness to palpation around the base of the second and third metacarpals.  No real pain with resisted wrist extension.  Negative Finkelstein's test but there is some swelling around the first dorsal compartment on the right.  Radial pulses intact.  No snuffbox tenderness.  EPL FPL interosseous strength is intact.  No definite paresthesias on the dorsal or palmar aspect of the right hand.  Specialty Comments:  No specialty comments available.  Imaging: No results found.   PMFS History: Patient Active Problem List   Diagnosis Date Noted   Radial styloid tenosynovitis (de quervain) 12/28/2020   Acute left-sided low back pain 12/08/2018   History of fusion of cervical spine 10/07/2018   Personal history of calcium pyrophosphate deposition disease (CPPD) 06/23/2018   Hyperlipidemia 06/23/2018   Vitamin D  deficiency 06/23/2018   Bilateral carpal tunnel syndrome 10/15/2017   Double crush syndrome 10/15/2017   Ductal carcinoma  in situ (DCIS) of right breast 12/06/2014   Past Medical History:  Diagnosis Date   Arthritis    hands   Bell's palsy    Cancer (HCC)    breast   Depression     Family History  Problem Relation Age of Onset   Lung cancer Father    Healthy Sister    Healthy Brother    Healthy Brother    Healthy Brother    Healthy Sister    Healthy Son    Healthy Son    Healthy Daughter    Healthy Daughter     Past Surgical History:  Procedure Laterality Date   ANTERIOR CERVICAL DECOMP/DISCECTOMY FUSION N/A 09/28/2018   Procedure: C5-6, C6-7 ANTERIOR CERVICAL DECOMPRESSION/DISCECTOMY ALLOGRAFT;  Surgeon: Barbarann Oneil BROCKS, MD;  Location: [facility];  Service: Orthopedics;  Laterality: N/A;   APPENDECTOMY     BREAST SURGERY Right    lumpectomy    CERVICAL POLYPECTOMY     COLONOSCOPY     TUBAL LIGATION     Social History   Occupational History   Not on file  Tobacco Use   Smoking status: Never   Smokeless tobacco: Never  Vaping Use   Vaping status: Never Used  Substance and Sexual Activity   Alcohol use: Yes    Comment: rarely   Drug use: Never   Sexual activity: Not on file

## 2023-09-26 ENCOUNTER — Ambulatory Visit
Admission: RE | Admit: 2023-09-26 | Discharge: 2023-09-26 | Disposition: A | Discharge status: Home / Self Care | Payer: BLUE CROSS/BLUE SHIELD | Source: Ambulatory Visit | Attending: Orthopedic Surgery | Admitting: Orthopedic Surgery

## 2023-09-26 DIAGNOSIS — M79641 Pain in right hand: Secondary | ICD-10-CM

## 2023-10-06 ENCOUNTER — Ambulatory Visit: Payer: BLUE CROSS/BLUE SHIELD | Admitting: Orthopedic Surgery

## 2023-10-10 ENCOUNTER — Ambulatory Visit (INDEPENDENT_AMBULATORY_CARE_PROVIDER_SITE_OTHER): Payer: BLUE CROSS/BLUE SHIELD | Admitting: Orthopedic Surgery

## 2023-10-10 ENCOUNTER — Encounter: Payer: Self-pay | Admitting: Orthopedic Surgery

## 2023-10-10 DIAGNOSIS — M79641 Pain in right hand: Secondary | ICD-10-CM

## 2023-10-10 NOTE — Progress Notes (Unsigned)
Office Visit Note   Patient: Bailey Berry           Date of Birth: [birth year]           MRN: [id number] Visit Date: 10/10/2023 Requested by: Terance Hart, PA-C [address] [place],  [place] [address] PCP: Charlette Caffey  Subjective: Chief Complaint  Patient presents with   Other    Review scan    HPI: Bailey Berry is a 63 y.o. female who presents to the office reporting continued right wrist pain.  Since she was last seen she has had an MRI scan of the right wrist.  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  Patient did take some medication for a while for her wrist.  She has been using a splint.  She states that the medication which is an anti-inflammatory has helped her shoulder but her wrist remains significantly painful and its central region.  We are considering an injection into the shoulder today.  Wrist is her main issue.  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                ROS: All systems reviewed are negative as they relate to the chief complaint within the history of present illness.  Patient denies fevers or chills.  Assessment & Plan: Visit Diagnoses:  1. Pain in right hand     Plan: Impression is right wrist pathology on both the radial styloid which is less symptomatic than the central pain she has which is likely related to her torn TFCC.  This may be something that could respond to an injection and/or arthroscopic debridement.  She may also require treatment of the severe tendinosis on the radial side at the same time.  Would like to refer her to Dr. Michae Kava for further evaluation and management.  She would like to potentially get an injection in her shoulder at the same time which I can perform after he sees her.  Follow-up at that time.  Luke or I could inject  the shoulder after her hand surgery appointment.  Follow-Up Instructions: No follow-ups on file.   Orders:  Orders Placed This Encounter  Procedures   Ambulatory referral to Orthopedic Surgery   No orders of the defined types were placed in this encounter.     Procedures: No procedures performed   Clinical Data: No additional findings.  Objective: Vital Signs: LMP 10/23/2017   Physical Exam:  Constitutional: Patient appears well-developed HEENT:  Head: Normocephalic Eyes:EOM are normal Neck: Normal range of motion Cardiovascular: Normal rate Pulmonary/chest: Effort normal Neurologic: Patient is alert Skin: Skin is warm Psychiatric: Patient has normal mood and affect  Ortho Exam: Ortho exam demonstrates central tenderness on that right wrist but her range of motion is symmetric in flexion extension as well as pronation supination.  Does have some tenderness and swelling around the radial styloid on the right compared to the left.  EPL FPL interosseous function is intact with palpable radial pulse.  No definite paresthesias in the hand.  The shoulder range of motion and exam is essentially symmetric in the same as it was last clinic visit.  No rotator cuff weakness.  Reasonable range of motion.  Specialty Comments:  No specialty comments available.  Imaging: No results found.  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   Past Medical History:  Diagnosis Date   Arthritis    hands   Bell's palsy    Cancer (HCC)     breast   Depression     Family History  Problem Relation Age of Onset   Lung cancer Father    Healthy Sister    Healthy Brother    Healthy Brother    Healthy Brother    Healthy Sister    Healthy Son    Healthy Son    Healthy Daughter    Healthy Daughter     Past Surgical History:  Procedure Laterality Date   ANTERIOR CERVICAL DECOMP/DISCECTOMY FUSION N/A 09/28/2018   Procedure: C5-6, C6-7 ANTERIOR CERVICAL DECOMPRESSION/DISCECTOMY ALLOGRAFT;  Surgeon: Eldred Manges, MD;  Location: [facility];  Service: Orthopedics;  Laterality: N/A;   APPENDECTOMY     BREAST SURGERY Right    lumpectomy    CERVICAL POLYPECTOMY     COLONOSCOPY     TUBAL LIGATION     Social History   Occupational History   Not on file  Tobacco Use   Smoking status: Never   Smokeless tobacco: Never  Vaping Use   Vaping status: Never Used  Substance and Sexual Activity   Alcohol use: Yes    Comment: rarely   Drug use: Never   Sexual activity: Not on file

## 2023-10-13 ENCOUNTER — Other Ambulatory Visit: Payer: Self-pay | Admitting: Orthopedic Surgery

## 2023-10-21 ENCOUNTER — Ambulatory Visit (INDEPENDENT_AMBULATORY_CARE_PROVIDER_SITE_OTHER): Payer: BLUE CROSS/BLUE SHIELD | Admitting: Orthopedic Surgery

## 2023-10-21 DIAGNOSIS — M654 Radial styloid tenosynovitis [de Quervain]: Secondary | ICD-10-CM | POA: Diagnosis not present

## 2023-10-21 MED ORDER — LIDOCAINE HCL 1 % IJ SOLN
1.0000 mL | INTRAMUSCULAR | Status: AC | PRN
  Administered 2023-10-21: 1 mL

## 2023-10-21 MED ORDER — BETAMETHASONE SOD PHOS & ACET 6 (3-3) MG/ML IJ SUSP
6.0000 mg | INTRAMUSCULAR | Status: AC | PRN
  Administered 2023-10-21: 6 mg via INTRA_ARTICULAR

## 2023-10-21 NOTE — Progress Notes (Signed)
Jakelyn Squyres - 63 y.o. female MRN [id number]  Date of birth: [birth year]  Office Visit Note: Visit Date: 10/21/2023 PCP: Terance Hart, PA-C Referred by: Cammy Copa, MD  Subjective: No chief complaint on file.  HPI: Bryonna Sundby is a pleasant 63 y.o. female who presents today for evaluation of ongoing wrist pain, mostly focused on the radial aspect of the wrist with radiation into the thumb and forearm.  She is quite active at baseline, works at the [employer] and does heavy work with the hands with thumb pinch which is making it worse.  Pertinent ROS were reviewed with the patient and found to be negative unless otherwise specified above in HPI.   Visit Reason: right wrist pain, radial side Duration of symptoms: 3+ months Hand dominance: right Occupation: Restaurant  Diabetic: No Smoking: No Heart/Lung History: none Blood Thinners: none  Prior Testing/EMG: MRI 10/05/23, xrays 08/22/23 Injections (Date): 2022 Treatments: brace, meloxicam Prior Surgery: none  Assessment & Plan: Visit Diagnoses: No diagnosis found.  Plan: Extensive discussion was had with the patient today regarding her ongoing radial sided wrist pain.  Clinically, she is demonstrating signs symptoms consistent with de Quervain's tenosynovitis.  Given that she has not undergone any significant conservative treatment specifically for de Quervain's tenosynovitis, I recommended that we move forward with cortisone injection today and application of a Comfort Cool brace for thumb immobilization moving forward.  I explained the underlying pathophysiology of de Quervain's tenosynovitis which does correlate with her previous MRI workup as well.  We did discuss that symptoms could remain refractory to conservative care and could warrant first extensor compartment release in the future.  Her pain is minimal on the ulnar aspect of the wrist, the TFCC findings on the MRI study are likely incidental and  more degenerative in nature.  Follow-up: No follow-ups on file.   Meds & Orders: No orders of the defined types were placed in this encounter.  No orders of the defined types were placed in this encounter.    Procedures: Hand/UE Inj: R extensor compartment 1 for de Quervain's tenosynovitis on 10/21/2023 3:19 PM Details: 25 G needle Medications: 1 mL lidocaine 1 %; 6 mg betamethasone acetate-betamethasone sodium phosphate 6 (3-3) MG/ML Outcome: tolerated well, no immediate complications Procedure, treatment alternatives, risks and benefits explained, specific risks discussed. Consent was given by the patient.          Clinical History: No specialty comments available.  She reports that she has never smoked. She has never used smokeless tobacco. No results for input(s): "HGBA1C", "LABURIC" in the last 8760 hours.  Objective:   Vital Signs: LMP 10/23/2017   Physical Exam  Gen: Well-appearing, in no acute distress; non-toxic CV: Regular Rate. Well-perfused. Warm.  Resp: Breathing unlabored on room air; no wheezing. Psych: Fluid speech in conversation; appropriate affect; normal thought process  Ortho Exam General: Patient is well appearing and in no distress. Cervical spine mobility is full in all directions:  Skin and Muscle: No skin changes are apparent to upper extremities.  Muscle bulk and contour normal, no signs of atrophy.     Range of Motion and Palpation Tests: Mobility is full about the elbows with flexion and extension.  Forearm supination and pronation are 85/85 bilaterally.  Wrist flexion/extension is 75/65 left side, wrist flexion/extension limited secondary to pain right side, approximately 45/45.  Digital flexion and extension are full.    No cords or nodules are palpated.  No triggering is observed.  Finklestein test is positive right side, significant pain with associated swelling and tenderness.    Neurologic, Vascular, Motor: Sensation is intact to light  touch in the median/radial/ulnar distributions.   Fingers pink and well perfused.  Capillary refill is brisk.     Imaging: Prior wrist x-ray and MRI of the wrist reviewed in chart  Past Medical/Family/Surgical/Social History: Medications & Allergies reviewed per EMR, new medications updated. Patient Active Problem List   Diagnosis Date Noted   Radial styloid tenosynovitis (de quervain) 12/28/2020   Acute left-sided low back pain 12/08/2018   History of fusion of cervical spine 10/07/2018   Personal history of calcium pyrophosphate deposition disease (CPPD) 06/23/2018   Hyperlipidemia 06/23/2018   Vitamin D deficiency 06/23/2018   Bilateral carpal tunnel syndrome 10/15/2017   Double crush syndrome 10/15/2017   Ductal carcinoma in situ (DCIS) of right breast 12/06/2014   Past Medical History:  Diagnosis Date   Arthritis    hands   Bell's palsy    Cancer (HCC)    breast   Depression    Family History  Problem Relation Age of Onset   Lung cancer Father    Healthy Sister    Healthy Brother    Healthy Brother    Healthy Brother    Healthy Sister    Healthy Son    Healthy Son    Healthy Daughter    Healthy Daughter    Past Surgical History:  Procedure Laterality Date   ANTERIOR CERVICAL DECOMP/DISCECTOMY FUSION N/A 09/28/2018   Procedure: C5-6, C6-7 ANTERIOR CERVICAL DECOMPRESSION/DISCECTOMY ALLOGRAFT;  Surgeon: Eldred Manges, MD;  Location: [facility] OR;  Service: Orthopedics;  Laterality: N/A;   APPENDECTOMY     BREAST SURGERY Right    lumpectomy    CERVICAL POLYPECTOMY     COLONOSCOPY     TUBAL LIGATION     Social History   Occupational History   Not on file  Tobacco Use   Smoking status: Never   Smokeless tobacco: Never  Vaping Use   Vaping status: Never Used  Substance and Sexual Activity   Alcohol use: Yes    Comment: rarely   Drug use: Never   Sexual activity: Not on file    Skyllar Notarianni Trevor Mace, M.D. [facility] 2:01 PM

## 2023-12-09 ENCOUNTER — Ambulatory Visit (INDEPENDENT_AMBULATORY_CARE_PROVIDER_SITE_OTHER): Payer: BLUE CROSS/BLUE SHIELD | Admitting: Orthopedic Surgery

## 2023-12-09 DIAGNOSIS — M654 Radial styloid tenosynovitis [de Quervain]: Secondary | ICD-10-CM | POA: Diagnosis not present

## 2023-12-09 NOTE — Progress Notes (Signed)
 Seraya Jobst - 63 y.o. female MRN [id number]  Date of birth: Nov 20, 1960  Office Visit Note: Visit Date: 12/09/2023 PCP: Terance Hart, PA-C Referred by: Terance Hart, PA-C  Subjective: No chief complaint on file.  HPI: Kebra Lowrimore is a pleasant 63 y.o. female who returns today for right wrist de Quervain's tenosynovitis being treated conservatively with prior injection approximate 6 weeks prior.  She was also utilizing a Comfort Cool brace which she has since discontinued.  States that her pain has resolved that she is pleased with her progress.  Pertinent ROS were reviewed with the patient and found to be negative unless otherwise specified above in HPI.    Assessment & Plan: Visit Diagnoses:  1. De Quervain's disease (tenosynovitis)     Plan: She is doing well with conservative treatments, and plan to see that the cortisone injection has helped improve her symptoms going forward.  At this juncture, she is cleared for activity as tolerated, can discontinue utilization of the brace.  She is welcome to return to me as needed moving forward.  She is pleased with her outcome.  Follow-up: No follow-ups on file.   Meds & Orders: No orders of the defined types were placed in this encounter.  No orders of the defined types were placed in this encounter.    Procedures: No procedures performed      Clinical History: No specialty comments available.  She reports that she has never smoked. She has never used smokeless tobacco. No results for input(s): "HGBA1C", "LABURIC" in the last 8760 hours.  Objective:   Vital Signs: LMP 10/23/2017   Physical Exam  Gen: Well-appearing, in no acute distress; non-toxic CV: Regular Rate. Well-perfused. Warm.  Resp: Breathing unlabored on room air; no wheezing. Psych: Fluid speech in conversation; appropriate affect; normal thought process  Ortho Exam General: Patient is well appearing and in no distress.   Skin and  Muscle: No skin changes are apparent to upper extremities.  Muscle bulk and contour normal, no signs of atrophy.     Range of Motion and Palpation Tests: Mobility is full about the elbows with flexion and extension.  Forearm supination and pronation are 85/85 bilaterally.  Wrist flexion/extension is 75/65 bilaterally.  Digital flexion and extension are full.    No cords or nodules are palpated.  No triggering is observed.   Finklestein test is negative right side, no significant pain, no swelling or tenderness radial aspect of the wrist  Neurologic, Vascular, Motor: Sensation is intact to light touch in the median/radial/ulnar distributions.   Fingers pink and well perfused.  Capillary refill is brisk.     Imaging: None  Past Medical/Family/Surgical/Social History: Medications & Allergies reviewed per EMR, new medications updated. Patient Active Problem List   Diagnosis Date Noted   Radial styloid tenosynovitis (de quervain) 12/28/2020   Acute left-sided low back pain 12/08/2018   History of fusion of cervical spine 10/07/2018   Personal history of calcium pyrophosphate deposition disease (CPPD) 06/23/2018   Hyperlipidemia 06/23/2018   Vitamin D deficiency 06/23/2018   Bilateral carpal tunnel syndrome 10/15/2017   Double crush syndrome 10/15/2017   Ductal carcinoma in situ (DCIS) of right breast 12/06/2014   Past Medical History:  Diagnosis Date   Arthritis    hands   Bell's palsy    Cancer (HCC)    breast   Depression    Family History  Problem Relation Age of Onset   Lung cancer Father  Healthy Sister    Healthy Brother    Healthy Brother    Healthy Brother    Healthy Sister    Healthy Son    Healthy Son    Healthy Daughter    Healthy Daughter    Past Surgical History:  Procedure Laterality Date   ANTERIOR CERVICAL DECOMP/DISCECTOMY FUSION N/A 09/28/2018   Procedure: C5-6, C6-7 ANTERIOR CERVICAL DECOMPRESSION/DISCECTOMY ALLOGRAFT;  Surgeon: Eldred Manges,  MD;  Location: [facility] OR;  Service: Orthopedics;  Laterality: N/A;   APPENDECTOMY     BREAST SURGERY Right    lumpectomy    CERVICAL POLYPECTOMY     COLONOSCOPY     TUBAL LIGATION     Social History   Occupational History   Not on file  Tobacco Use   Smoking status: Never   Smokeless tobacco: Never  Vaping Use   Vaping status: Never Used  Substance and Sexual Activity   Alcohol use: Yes    Comment: rarely   Drug use: Never   Sexual activity: Not on file    Sherrica Niehaus Trevor Mace, M.D. [facility]

## 2024-05-17 ENCOUNTER — Ambulatory Visit: Admitting: Surgical

## 2024-06-21 ENCOUNTER — Ambulatory Visit: Admitting: Surgical

## 2024-06-21 ENCOUNTER — Other Ambulatory Visit (INDEPENDENT_AMBULATORY_CARE_PROVIDER_SITE_OTHER): Payer: Self-pay

## 2024-06-21 ENCOUNTER — Encounter: Payer: Self-pay | Admitting: Surgical

## 2024-06-21 DIAGNOSIS — M542 Cervicalgia: Secondary | ICD-10-CM

## 2024-06-21 DIAGNOSIS — M25512 Pain in left shoulder: Secondary | ICD-10-CM | POA: Diagnosis not present

## 2024-06-21 MED ORDER — METHOCARBAMOL 500 MG PO TABS: 500.0000 mg | ORAL_TABLET | Freq: Three times a day (TID) | ORAL | 0 refills | Status: AC | PRN

## 2024-06-25 ENCOUNTER — Encounter: Payer: Self-pay | Admitting: Surgical

## 2024-06-27 ENCOUNTER — Encounter: Payer: Self-pay | Admitting: Surgical

## 2024-06-27 NOTE — Progress Notes (Signed)
 Office Visit Note   Patient: Bailey Berry           Date of Birth: 1961/08/25           MRN: [id number] Visit Date: 06/21/2024 Requested by: Perri Bobie SAUNDERS, PA-C [address] [place],  [place] [address] PCP: Perri Bobie SAUNDERS DEVONNA  Subjective: Chief Complaint  Patient presents with   Neck - Pain    HPI: Bailey Berry is a 63 y.o. female who presents to the office reporting left-sided neck pain.  Patient states that pain has been present for 1 month without any history of known injury.  She describes pain that she just noticed 1 morning upon waking up.  Localizes pain to the trapezius region and pain is worse with any range of motion of her neck.  She has some scapular pain as well.  Denies any new headaches, radicular pain down the arm or numbness/tingling down the arm.  She states that is very minimal only improved in the last month and still causes significant difficulty with sleeping, waking her up pretty much every night.  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  She has no pain with shoulder range of motion.  Has prior history of ACDF with Dr. Barbarann in 2019.  This has done well for her.  She has tried heat and ibuprofen without any relief..                ROS: All systems reviewed are negative as they relate to the chief complaint within the history of present illness.  Patient denies fevers or chills.  Assessment & Plan: Visit Diagnoses:  1. Pain radiating to left shoulder   2. Neck pain     Plan: Impression is 63 year old female who has left sided neck pain over the last month since waking up with pain.  It is worse with neck rotation and any range of motion of the neck with positive Spurling sign and positive Lhermitte sign on exam today.  She has history of prior ACDF from 2019 that has done well for her but now she is suffering quite a lot over the last month with the symptoms.  After  discussion of options, plan to order MRI of the cervical spine for further evaluation of left-sided radicular symptoms as well as prescribe Robaxin  to see if this will help with some of her symptoms in addition to the ibuprofen that she already has.  Follow-up after MRI to review results.  Follow-Up Instructions: No follow-ups on file.   Orders:  Orders Placed This Encounter  Procedures   XR Cervical Spine 2 or 3 views   MR Cervical Spine w/o contrast   Meds ordered this encounter  Medications   methocarbamol  (ROBAXIN ) 500 MG tablet    Sig: Take 1 tablet (500 mg total) by mouth every 8 (eight) hours as needed for muscle spasms.    Dispense:  30 tablet    Refill:  0      Procedures: No procedures performed   Clinical Data: No additional findings.  Objective: Vital Signs: LMP 10/23/2017   Physical Exam:  Constitutional: Patient appears well-developed HEENT:  Head: Normocephalic Eyes:EOM are normal Neck: Normal range of motion Cardiovascular: Normal rate Pulmonary/chest: Effort normal Neurologic: Patient is alert Skin: Skin is warm Psychiatric: Patient has normal mood and affect  Ortho Exam: Ortho exam demonstrates positive Spurling sign and positive Lhermitte sign reproducing left-sided trapezius  pain.  She has limited extension and flexion as well as rotation of the cervical spine secondary to pain that does reproduce her left sided trapezius pain as well as some scapular pain.  Intact EPL, FPL, finger abduction, pronation/supination, bicep, tricep, deltoid strength of bilateral arms rated 5/5.  2+ radial pulse of bilateral upper extremities.  No tenderness over bicipital groove or AC joint of either shoulder.  Does have some mild tenderness over the cervical spine.  Specialty Comments:  No specialty comments available.  Imaging: No results found.   PMFS History: Patient Active Problem List   Diagnosis Date Noted   Radial styloid tenosynovitis (de quervain)  12/28/2020   Acute left-sided low back pain 12/08/2018   History of fusion of cervical spine 10/07/2018   Personal history of calcium pyrophosphate deposition disease (CPPD) 06/23/2018   Hyperlipidemia 06/23/2018   Vitamin D  deficiency 06/23/2018   Bilateral carpal tunnel syndrome 10/15/2017   Double crush syndrome 10/15/2017   Ductal carcinoma in situ (DCIS) of right breast 12/06/2014   Past Medical History:  Diagnosis Date   Arthritis    hands   Bell's palsy    Cancer (HCC)    breast   Depression     Family History  Problem Relation Age of Onset   Lung cancer Father    Healthy Sister    Healthy Brother    Healthy Brother    Healthy Brother    Healthy Sister    Healthy Son    Healthy Son    Healthy Daughter    Healthy Daughter     Past Surgical History:  Procedure Laterality Date   ANTERIOR CERVICAL DECOMP/DISCECTOMY FUSION N/A 09/28/2018   Procedure: C5-6, C6-7 ANTERIOR CERVICAL DECOMPRESSION/DISCECTOMY ALLOGRAFT;  Surgeon: Barbarann Oneil BROCKS, MD;  Location: [facility] OR;  Service: Orthopedics;  Laterality: N/A;   APPENDECTOMY     BREAST SURGERY Right    lumpectomy    CERVICAL POLYPECTOMY     COLONOSCOPY     TUBAL LIGATION     Social History   Occupational History   Not on file  Tobacco Use   Smoking status: Never   Smokeless tobacco: Never  Vaping Use   Vaping status: Never Used  Substance and Sexual Activity   Alcohol use: Yes    Comment: rarely   Drug use: Never   Sexual activity: Not on file

## 2024-07-06 ENCOUNTER — Telehealth: Payer: Self-pay | Admitting: Orthopedic Surgery

## 2024-07-06 NOTE — Telephone Encounter (Signed)
 Scheduled for 11/6 @ 1pm. Patient advised.

## 2024-07-06 NOTE — Telephone Encounter (Signed)
 Called pt to make an appt for MRI review on 11/1. The only opening for [facility] and [facility] are 11/17 and [place]. Pt is asking for an earlier appt before 11/17 or call her with results when MRI is back. Pt phone number is [phone].

## 2024-07-10 ENCOUNTER — Ambulatory Visit
Admission: RE | Admit: 2024-07-10 | Discharge: 2024-07-10 | Disposition: A | Source: Ambulatory Visit | Attending: Surgical

## 2024-07-10 DIAGNOSIS — M542 Cervicalgia: Secondary | ICD-10-CM

## 2024-07-12 ENCOUNTER — Encounter: Payer: Self-pay | Admitting: Radiology

## 2024-07-15 ENCOUNTER — Encounter: Payer: Self-pay | Admitting: Surgical

## 2024-07-15 ENCOUNTER — Ambulatory Visit: Admitting: Surgical

## 2024-07-15 ENCOUNTER — Other Ambulatory Visit: Payer: Self-pay

## 2024-07-15 DIAGNOSIS — M25511 Pain in right shoulder: Secondary | ICD-10-CM | POA: Diagnosis not present

## 2024-07-15 NOTE — Progress Notes (Signed)
 Office Visit Note   Patient: Bailey Berry           Date of Birth: 12-06-1960           MRN: [id number] Visit Date: 07/15/2024 Requested by: Perri Bobie SAUNDERS, PA-C [address] [place],  [place] [address] PCP: Perri Bobie SAUNDERS, PA-C  Subjective: No chief complaint on file.   HPI: Bailey Berry is a 63 y.o. female who presents to the office for MRI review. Patient does report that her left neck and shoulder are feeling substantially better.  Unfortunately she did strain her right shoulder when she was lifting a box off of a top shelf while helping at her [employer].  Since then she has had anterior right shoulder pain without radiation.  No numbness/tingling, mechanical symptoms, weakness.  Just pain.  No history of prior injury to this shoulder.  MRI results revealed: MR Cervical Spine w/o contrast Result Date: 07/11/2024 CLINICAL DATA:  Initial evaluation for neck pain with right shoulder pain for 2 months. Prior surgery. EXAM: MRI CERVICAL SPINE WITHOUT CONTRAST TECHNIQUE: Multiplanar, multisequence MR imaging of the cervical spine was performed. No intravenous contrast was administered. COMPARISON:  Comparison made with prior MRI from 09/12/2018. FINDINGS: Alignment: Straightening of the normal cervical lordosis. No listhesis. Vertebrae: Prior ACDF at C5-C7. Vertebral body height maintained without acute or chronic fracture. Bone marrow signal intensity within normal limits. No worrisome osseous lesions. No abnormal marrow edema. Cord: Small focus of signal abnormality seen involving the right ventral cord at the level of C6-7, consistent with a small focus of chronic myelomalacia (series 108, image 24). This is new from prior. Otherwise normal signal and morphology. Posterior Fossa, vertebral arteries, paraspinal tissues: Unremarkable. Disc levels: C2-C3: Small central to right paracentral disc protrusion indents the ventral thecal sac (series 108, image 7). No spinal  stenosis. Foramina remain patent. C3-C4: Mild intervertebral disc space narrowing with diffuse disc osteophyte complex. Flattening of the ventral thecal sac with minimal cord flattening, but no cord signal changes. No more than mild spinal stenosis. Mild bilateral C4 foraminal narrowing. C4-C5: Mild disc bulge with uncovertebral spurring. No significant spinal stenosis. Mild right C5 foraminal narrowing. Left neural foramina remains patent. C5-C6: Prior fusion. No residual spinal stenosis. Foramina appear adequately patent. C6-C7: Prior fusion. No residual spinal stenosis. Foramina appear adequately patent. C7-T1: Mild disc bulge with uncovertebral spurring. Mild facet and ligament flavum hypertrophy. No spinal stenosis. Foramina remain patent. Small perineural cyst at the left C8 neural foramen noted. IMPRESSION: 1. Prior ACDF at C5-C7 without residual or recurrent stenosis. 2. Small focus of chronic myelomalacia involving the right ventral cord at the level of C6-7, new from prior. 3. Disc osteophyte complex at C3-4 with resultant mild spinal stenosis, with mild bilateral C4 foraminal narrowing. 4. Mild disc bulge with uncovertebral spurring at C4-5 with resultant mild right C5 foraminal stenosis. Electronically Signed   By: Morene Hoard M.D.   On: 07/11/2024 20:00                 ROS: All systems reviewed are negative as they relate to the chief complaint within the history of present illness.  Patient denies fevers or chills.  Assessment & Plan: Visit Diagnoses:  1. Right shoulder pain, unspecified chronicity     Plan: Bailey Berry is a 63 y.o. female who presents to the office for MRI review of cervical spine and evaluation of right shoulder pain.  MRI demonstrates no significant acute finding aside from some  mild foraminal stenosis at C4-5 bilaterally.  With her significant symptomatic improvement, no need for intervention at this time.  Regarding right shoulder impression is what  sounds like subscapularis strain.  With her 50% improvement over the last several weeks and no nighttime pain or weakness on exam, plan to watch this for now but recommended patient return if she does not have full symptomatic improvement 4 to 6 weeks from now.  Patient agreed with plan.  Follow-up as needed.  Follow-Up Instructions: No follow-ups on file.   Orders:  No orders of the defined types were placed in this encounter.  No orders of the defined types were placed in this encounter.     Procedures: No procedures performed   Clinical Data: No additional findings.  Objective: Vital Signs: LMP 10/23/2017   Physical Exam:  Constitutional: Patient appears well-developed HEENT:  Head: Normocephalic Eyes:EOM are normal Neck: Normal range of motion Cardiovascular: Normal rate Pulmonary/chest: Effort normal Neurologic: Patient is alert Skin: Skin is warm Psychiatric: Patient has normal mood and affect  Ortho Exam: Ortho exam demonstrates right shoulder with 80 degrees X rotation, 130 degrees abduction, 170 degrees forward elevation passively and actively.  This compared with left shoulder with 70 degrees of external rotation and similar abduction and forward elevation.  She has excellent subscap, infra, supra strength of the right shoulder.  Subscap strength testing and passive external rotation do reproduce her pain.  Tenderness over the lesser tuberosity.  No tenderness over the [facility] joint.  No Popeye deformity.  Intact EPL, FPL, finger abduction.  Palpable radial pulse of the right upper extremity.    Specialty Comments:  No specialty comments available.  Imaging: No results found.   PMFS History: Patient Active Problem List   Diagnosis Date Noted   Radial styloid tenosynovitis (de quervain) 12/28/2020   Acute left-sided low back pain 12/08/2018   History of fusion of cervical spine 10/07/2018   Personal history of calcium pyrophosphate deposition disease (CPPD)  06/23/2018   Hyperlipidemia 06/23/2018   Vitamin D  deficiency 06/23/2018   Bilateral carpal tunnel syndrome 10/15/2017   Double crush syndrome 10/15/2017   Ductal carcinoma in situ (DCIS) of right breast 12/06/2014   Past Medical History:  Diagnosis Date   Arthritis    hands   Bell's palsy    Cancer (HCC)    breast   Depression     Family History  Problem Relation Age of Onset   Lung cancer Father    Healthy Sister    Healthy Brother    Healthy Brother    Healthy Brother    Healthy Sister    Healthy Son    Healthy Son    Healthy Daughter    Healthy Daughter     Past Surgical History:  Procedure Laterality Date   ANTERIOR CERVICAL DECOMP/DISCECTOMY FUSION N/A 09/28/2018   Procedure: C5-6, C6-7 ANTERIOR CERVICAL DECOMPRESSION/DISCECTOMY ALLOGRAFT;  Surgeon: Barbarann Oneil BROCKS, MD;  Location: [facility] OR;  Service: Orthopedics;  Laterality: N/A;   APPENDECTOMY     BREAST SURGERY Right    lumpectomy    CERVICAL POLYPECTOMY     COLONOSCOPY     TUBAL LIGATION     Social History   Occupational History   Not on file  Tobacco Use   Smoking status: Never   Smokeless tobacco: Never  Vaping Use   Vaping status: Never Used  Substance and Sexual Activity   Alcohol use: Yes    Comment: rarely   Drug use: Never  Sexual activity: Not on file

## 2024-07-19 ENCOUNTER — Other Ambulatory Visit: Payer: Self-pay | Admitting: Surgical Oncology

## 2024-07-19 DIAGNOSIS — Z1211 Encounter for screening for malignant neoplasm of colon: Secondary | ICD-10-CM

## 2024-08-24 ENCOUNTER — Inpatient Hospital Stay: Admission: RE | Admit: 2024-08-24 | Discharge: 2024-08-24 | Attending: Surgical Oncology | Admitting: Surgical Oncology

## 2024-08-24 DIAGNOSIS — Z1211 Encounter for screening for malignant neoplasm of colon: Secondary | ICD-10-CM
